# Patient Record
Sex: Female | Born: 1975 | Race: White | Hispanic: No | Marital: Married | Smoking: Never smoker
Health system: Southern US, Community
[De-identification: ages and names within clinical notes are randomized; demographics above are authoritative.]

## PROBLEM LIST (undated history)

## (undated) DIAGNOSIS — F419 Anxiety disorder, unspecified: Secondary | ICD-10-CM

## (undated) DIAGNOSIS — I10 Essential (primary) hypertension: Secondary | ICD-10-CM

---

## 1996-05-01 HISTORY — PX: BREAST REDUCTION SURGERY: SHX8

## 1997-12-08 ENCOUNTER — Inpatient Hospital Stay (HOSPITAL_COMMUNITY): Admission: EM | Admit: 1997-12-08 | Discharge: 1997-12-10 | Payer: Self-pay | Admitting: Emergency Medicine

## 1998-01-15 ENCOUNTER — Other Ambulatory Visit: Admission: RE | Admit: 1998-01-15 | Discharge: 1998-01-15 | Payer: Self-pay | Admitting: Obstetrics and Gynecology

## 1999-03-08 ENCOUNTER — Other Ambulatory Visit: Admission: RE | Admit: 1999-03-08 | Discharge: 1999-03-08 | Payer: Self-pay | Admitting: Obstetrics and Gynecology

## 2000-05-15 ENCOUNTER — Other Ambulatory Visit: Admission: RE | Admit: 2000-05-15 | Discharge: 2000-05-15 | Payer: Self-pay | Admitting: Obstetrics and Gynecology

## 2001-02-19 ENCOUNTER — Other Ambulatory Visit: Admission: RE | Admit: 2001-02-19 | Discharge: 2001-02-19 | Payer: Self-pay | Admitting: Obstetrics and Gynecology

## 2002-03-07 ENCOUNTER — Other Ambulatory Visit: Admission: RE | Admit: 2002-03-07 | Discharge: 2002-03-07 | Payer: Self-pay | Admitting: Obstetrics and Gynecology

## 2002-09-07 ENCOUNTER — Inpatient Hospital Stay (HOSPITAL_COMMUNITY): Admission: AD | Admit: 2002-09-07 | Discharge: 2002-09-07 | Payer: Self-pay | Admitting: Obstetrics and Gynecology

## 2002-10-01 ENCOUNTER — Inpatient Hospital Stay (HOSPITAL_COMMUNITY): Admission: AD | Admit: 2002-10-01 | Discharge: 2002-10-06 | Payer: Self-pay | Admitting: Obstetrics and Gynecology

## 2002-10-07 ENCOUNTER — Encounter: Admission: RE | Admit: 2002-10-07 | Discharge: 2002-11-06 | Payer: Self-pay | Admitting: Obstetrics and Gynecology

## 2002-10-28 ENCOUNTER — Other Ambulatory Visit: Admission: RE | Admit: 2002-10-28 | Discharge: 2002-10-28 | Payer: Self-pay | Admitting: Obstetrics and Gynecology

## 2004-01-31 ENCOUNTER — Emergency Department (HOSPITAL_COMMUNITY): Admission: EM | Admit: 2004-01-31 | Discharge: 2004-01-31 | Payer: Self-pay | Admitting: Emergency Medicine

## 2004-09-13 ENCOUNTER — Ambulatory Visit: Payer: Self-pay | Admitting: Infectious Diseases

## 2004-09-19 ENCOUNTER — Ambulatory Visit: Payer: Self-pay | Admitting: Infectious Diseases

## 2006-12-27 ENCOUNTER — Inpatient Hospital Stay (HOSPITAL_COMMUNITY): Admission: AD | Admit: 2006-12-27 | Discharge: 2006-12-30 | Payer: Self-pay | Admitting: Obstetrics and Gynecology

## 2007-01-02 ENCOUNTER — Inpatient Hospital Stay (HOSPITAL_COMMUNITY): Admission: AD | Admit: 2007-01-02 | Discharge: 2007-01-02 | Payer: Self-pay | Admitting: *Deleted

## 2008-10-06 ENCOUNTER — Emergency Department (HOSPITAL_COMMUNITY): Admission: EM | Admit: 2008-10-06 | Discharge: 2008-10-06 | Payer: Self-pay | Admitting: Emergency Medicine

## 2009-01-05 ENCOUNTER — Emergency Department (HOSPITAL_COMMUNITY): Admission: EM | Admit: 2009-01-05 | Discharge: 2009-01-05 | Payer: Self-pay | Admitting: Emergency Medicine

## 2009-05-01 HISTORY — PX: APPENDECTOMY: SHX54

## 2009-08-09 ENCOUNTER — Inpatient Hospital Stay (HOSPITAL_COMMUNITY): Admission: EM | Admit: 2009-08-09 | Discharge: 2009-08-10 | Payer: Self-pay | Admitting: Surgery

## 2009-08-09 ENCOUNTER — Encounter (INDEPENDENT_AMBULATORY_CARE_PROVIDER_SITE_OTHER): Payer: Self-pay | Admitting: Surgery

## 2009-08-09 ENCOUNTER — Encounter: Payer: Self-pay | Admitting: Emergency Medicine

## 2010-07-20 LAB — GC/CHLAMYDIA PROBE AMP, GENITAL
Chlamydia, DNA Probe: NEGATIVE
GC Probe Amp, Genital: NEGATIVE

## 2010-07-20 LAB — DIFFERENTIAL
Basophils Absolute: 0 10*3/uL (ref 0.0–0.1)
Basophils Absolute: 0 10*3/uL (ref 0.0–0.1)
Basophils Absolute: 0 10*3/uL (ref 0.0–0.1)
Basophils Relative: 0 % (ref 0–1)
Basophils Relative: 0 % (ref 0–1)
Basophils Relative: 1 % (ref 0–1)
Eosinophils Absolute: 0.2 10*3/uL (ref 0.0–0.7)
Eosinophils Absolute: 0.2 10*3/uL (ref 0.0–0.7)
Eosinophils Absolute: 0.3 10*3/uL (ref 0.0–0.7)
Eosinophils Relative: 4 % (ref 0–5)
Eosinophils Relative: 4 % (ref 0–5)
Eosinophils Relative: 5 % (ref 0–5)
Lymphocytes Relative: 19 % (ref 12–46)
Lymphocytes Relative: 23 % (ref 12–46)
Lymphocytes Relative: 25 % (ref 12–46)
Lymphs Abs: 1 10*3/uL (ref 0.7–4.0)
Lymphs Abs: 1.2 10*3/uL (ref 0.7–4.0)
Lymphs Abs: 1.4 10*3/uL (ref 0.7–4.0)
Monocytes Absolute: 0.4 10*3/uL (ref 0.1–1.0)
Monocytes Absolute: 0.5 10*3/uL (ref 0.1–1.0)
Monocytes Absolute: 0.5 10*3/uL (ref 0.1–1.0)
Monocytes Relative: 10 % (ref 3–12)
Monocytes Relative: 8 % (ref 3–12)
Monocytes Relative: 9 % (ref 3–12)
Neutro Abs: 2.6 10*3/uL (ref 1.7–7.7)
Neutro Abs: 3.5 10*3/uL (ref 1.7–7.7)
Neutro Abs: 4.3 10*3/uL (ref 1.7–7.7)
Neutrophils Relative %: 62 % (ref 43–77)
Neutrophils Relative %: 62 % (ref 43–77)
Neutrophils Relative %: 69 % (ref 43–77)

## 2010-07-20 LAB — URINALYSIS, ROUTINE W REFLEX MICROSCOPIC
Bilirubin Urine: NEGATIVE
Glucose, UA: NEGATIVE mg/dL
Hgb urine dipstick: NEGATIVE
Ketones, ur: NEGATIVE mg/dL
Nitrite: NEGATIVE
Protein, ur: NEGATIVE mg/dL
Specific Gravity, Urine: 1.006 (ref 1.005–1.030)
Urobilinogen, UA: 0.2 mg/dL (ref 0.0–1.0)
pH: 7.5 (ref 5.0–8.0)

## 2010-07-20 LAB — COMPREHENSIVE METABOLIC PANEL
ALT: 16 U/L (ref 0–35)
AST: 16 U/L (ref 0–37)
Albumin: 3.9 g/dL (ref 3.5–5.2)
Alkaline Phosphatase: 58 U/L (ref 39–117)
BUN: 8 mg/dL (ref 6–23)
CO2: 26 mEq/L (ref 19–32)
Calcium: 9.1 mg/dL (ref 8.4–10.5)
Chloride: 105 mEq/L (ref 96–112)
Creatinine, Ser: 0.88 mg/dL (ref 0.4–1.2)
GFR calc Af Amer: >60 mL/min (ref >60)
GFR calc non Af Amer: >60 mL/min (ref >60)
Glucose, Bld: 108 mg/dL — ABNORMAL HIGH (ref 70–99)
Potassium: 3.8 mEq/L (ref 3.5–5.1)
Sodium: 137 mEq/L (ref 135–145)
Total Bilirubin: 0.8 mg/dL (ref 0.3–1.2)
Total Protein: 6.9 g/dL (ref 6.0–8.3)

## 2010-07-20 LAB — WET PREP, GENITAL
Trich, Wet Prep: NONE SEEN
WBC, Wet Prep HPF POC: NONE SEEN
Yeast Wet Prep HPF POC: NONE SEEN

## 2010-07-20 LAB — CBC
HCT: 32.9 % — ABNORMAL LOW (ref 36.0–46.0)
HCT: 35.3 % — ABNORMAL LOW (ref 36.0–46.0)
HCT: 37.3 % (ref 36.0–46.0)
Hemoglobin: 11.6 g/dL — ABNORMAL LOW (ref 12.0–15.0)
Hemoglobin: 12.7 g/dL (ref 12.0–15.0)
Hemoglobin: 13 g/dL (ref 12.0–15.0)
MCHC: 34.9 g/dL (ref 30.0–36.0)
MCHC: 35.2 g/dL (ref 30.0–36.0)
MCHC: 35.9 g/dL (ref 30.0–36.0)
MCV: 96 fL (ref 78.0–100.0)
MCV: 96.3 fL (ref 78.0–100.0)
MCV: 96.9 fL (ref 78.0–100.0)
Platelets: 175 10*3/uL (ref 150–400)
Platelets: 194 10*3/uL (ref 150–400)
Platelets: 210 10*3/uL (ref 150–400)
RBC: 3.4 MIL/uL — ABNORMAL LOW (ref 3.87–5.11)
RBC: 3.67 MIL/uL — ABNORMAL LOW (ref 3.87–5.11)
RBC: 3.89 MIL/uL (ref 3.87–5.11)
RDW: 12.4 % (ref 11.5–15.5)
RDW: 12.5 % (ref 11.5–15.5)
RDW: 12.8 % (ref 11.5–15.5)
WBC: 4.2 10*3/uL (ref 4.0–10.5)
WBC: 5.8 10*3/uL (ref 4.0–10.5)
WBC: 6.3 10*3/uL (ref 4.0–10.5)

## 2010-07-20 LAB — LIPASE, BLOOD: Lipase: 26 U/L (ref 11–59)

## 2010-07-20 LAB — POCT PREGNANCY, URINE: Preg Test, Ur: NEGATIVE

## 2010-09-13 NOTE — Op Note (Signed)
Jackie Leonard, Jackie Leonard             ACCOUNT NO.:  [id number]   MEDICAL RECORD NO.:  [id number]          PATIENT TYPE:  INP   LOCATION:  9128                          FACILITY:  [facility]   PHYSICIAN:  Lenoard Aden, M.D.DATE OF BIRTH:  01/13/1976   DATE OF PROCEDURE:  12/27/2006  DATE OF DISCHARGE:                               OPERATIVE REPORT   PREOPERATIVE DIAGNOSES:  1. A 37-week intrauterine pregnancy.  2. New onset mild preeclampsia with labile blood pressures.   POSTOPERATIVE DIAGNOSES:  1. A 37-week intrauterine pregnancy.  2. New onset mild preeclampsia with labile blood pressures.   PROCEDURE:  Repeat low segment transverse cesarean section.   SURGEON:  Lenoard Aden, M.D.   ASSISTANT:  Marlinda Mike, C.N.M.   ANESTHESIA:  Spinal.   BLOOD LOSS:  1000 mL.   COMPLICATIONS:  None.   DRAINS:  Foley.   COUNTS:  Correct.   Patient to recovery in good condition.   FINDINGS:  Full-term living female, Apgars 8 and 9, occiput transverse  position, delivered with vacuum one pull, placenta to L&D normal tubes,  normal ovaries.  Omental adhesions to anterior abdominal wall.   DESCRIPTION OF PROCEDURE:  After being apprised of risks of anesthesia,  infection, bleeding, injury to abdominal organs with need for repair,  delayed versus immediate complications to include bowel and bladder  injury with need for repair, patient brought to the operating where she  was administered spinal anesthetic without complications, prepped and  draped in usual sterile fashion.  Foley catheter placed.  After  achieving adequate anesthesia and dilute Marcaine solution placed, a  Pfannenstiel skin incision made with a scalpel, carried down to the  fascia which was nicked in the midline and opened transversely using  Mayo scissors.  Rectus muscles dissected sharply in the midline.  Peritoneum entered sharply.  Bladder blade placed.  Visceral peritoneum  scored sharply lower uterine segment.   Kerr hysterotomy incision made.  Atraumatic delivery of a full-term living female by vacuum assistance,  handed to pediatricians in attendance.  Apgars of 8 and 9.  Pediatricians in attendance.  Cord blood collected.  Placenta delivered  manually, intact three-vessel cord.  Uterus curetted using a dry lap  pack and closed in two running imbricating layers of 0 Monocryl suture.  Good hemostasis is noted.  Bladder flap is inspected and found to be  hemostatic.  Irrigation accomplished.  Omental adhesions to the anterior  abdominal wall were lysed using electrocautery without difficulty.  No  bleeding is noted at this time.  Fascia is then closed using 0 Monocryl  in continuous running fashion.  The old skin incision was removed by  sharp dissection with the scalpel.  Skin is then closed using staples.  The patient tolerates procedure well, to recovery, good condition.      Lenoard Aden, M.D.  Electronically Signed     RJT/MEDQ  D:  12/27/2006  T:  12/28/2006  Job:  [id number]

## 2010-09-16 NOTE — Op Note (Signed)
Jackie Leonard, Jackie Leonard                         ACCOUNT NO.:  [id number]   MEDICAL RECORD NO.:  [id number]                   PATIENT TYPE:  INP   LOCATION:  9125                                 FACILITY:  [facility]   PHYSICIAN:  Lenoard Aden, M.D.             DATE OF BIRTH:  [birth year]   DATE OF PROCEDURE:  10/03/2002  DATE OF DISCHARGE:                                 OPERATIVE REPORT   PREOPERATIVE DIAGNOSES:  1. Thirty-eight week intrauterine pregnancy.  2. Gestational hypertension.  3. Failed induction.   POSTOPERATIVE DIAGNOSES:  1. Thirty-eight week intrauterine pregnancy.  2. Gestational hypertension.  3. Failed induction.   PROCEDURE:  Primary low transverse cesarean section.   SURGEON:  Lenoard Aden, M.D.   ANESTHESIA:  Epidural.   ESTIMATED BLOOD LOSS:  1500 mL.   COUNTS:  Correct.   No complications.   DRAINS:  Foley.   Patient to recovery in good condition.   FINDINGS:  A full-term living female, 8 pounds 1 ounce, Apgars 7 and 9.  Placenta delivered from a posterior location intact, a three-vessel cord  noted, normal tubes and ovaries.  Uterine atony controlled with 40 units of  Pitocin in the IV solution in addition to Hemabate x1.  Patient to recovery  in good condition.   BRIEF OPERATIVE NOTE:  After being apprised of the risks of anesthesia,  infection, bleeding, injury to abdominal organs and need for repair, the  patient was brought to the operating room, where she was administered a  dosing of her epidural anesthetic without complications, prepped and draped  in the usual sterile fashion, a Foley catheter placed.  After achieving  adequate anesthesia, a dilute Marcaine solution placed in the area.  A  Pfannenstiel skin incision was then made with a scalpel, carried down to the  fascia and nicked in the midline and opened transversely using Mayo  scissors.  Rectus muscles dissected sharply in the midline, peritoneum  entered sharply, bladder  blade placed, visceral peritoneum scored in a smile-  like fashion, uterus scored in a smile-like fashion.  Atraumatic delivery of  a full-term living female, 8 pounds 1 ounce, who was handed to the  pediatrician in attendance, Apgars 7 and 9.  Placenta delivered from a  posterior location intact, three-vessel cord noted.  The uterus was  exteriorized, uterine atony controlled with IV Pitocin solution and  Hemabate.  Incision then closed in two layers using a 0 Monocryl suture  after curetting the uterus with a dry lap pack.  Good uterine tone is then  accomplished.  Bladder flap is inspected and found to be hemostatic.  The  uterus is replaced in the abdominal cavity and reinspected, the paracolic  gutters irrigated, all blood clots subsequently removed.  Inspection of the  fascial and rectus muscle surfaces reveals good hemostasis.  Fascia closed  using the 0 Monocryl in continuous running fashion, skin closed using  staples.  The patient tolerates the procedure well and is transferred to the  recovery room in good condition.                                               Lenoard Aden, M.D.    RJT/MEDQ  D:  10/03/2002  T:  10/03/2002  Job:  [id number]

## 2010-09-16 NOTE — Discharge Summary (Signed)
Jackie Leonard, Jackie Leonard             ACCOUNT NO.:  [id number]   MEDICAL RECORD NO.:  [id number]          PATIENT TYPE:  INP   LOCATION:  9128                          FACILITY:  [facility]   PHYSICIAN:  Lenoard Aden, M.D.DATE OF BIRTH:  Sep 18, 1975   DATE OF ADMISSION:  12/27/2006  DATE OF DISCHARGE:  12/30/2006                               DISCHARGE SUMMARY   HOSPITAL COURSE:  The patient underwent uncomplicated repeat C section.  Postoperative course uncomplicated.  Tolerated a regular diet well.   DISPOSITION:  Discharged to home day 3.   DISCHARGE INSTRUCTIONS:  Discharge teaching done.   FOLLOWUP:  In the office in 4 to 6 weeks. Hypertensive precautions  discussed.   DISCHARGE MEDICATIONS:  To include  1. Labetalol.  2. Prenatal vitamins.  3. Iron.  4. Tylox.   FOLLOWUP:  As noted.      Lenoard Aden, M.D.  Electronically Signed     RJT/MEDQ  D:  01/29/2007  T:  01/29/2007  Job:  [id number]

## 2010-09-16 NOTE — H&P (Signed)
   Jackie Leonard, Jackie Leonard                         ACCOUNT NO.:  [id number]   MEDICAL RECORD NO.:  [id number]                   PATIENT TYPE:  INP   LOCATION:  9173                                 FACILITY:  [facility]   PHYSICIAN:  Lenoard Aden, M.D.             DATE OF BIRTH:  [birth year]   DATE OF ADMISSION:  10/01/2002  DATE OF DISCHARGE:                                HISTORY & PHYSICAL   CHIEF COMPLAINT:  Pregnancy induced hypertension.   HISTORY OF PRESENT ILLNESS:  The patient is a 35 year old white female, G2,  P0, EDD of October 19, 2002, at [redacted] weeks gestation who presents for induction  due to stable pregnancy induced hypertension.  She is also in need of  cervical ripening to begin this evening.   MEDICATIONS:  1. Labetalol.  2. Zoloft.   ALLERGIES:  1. VICODIN.  2. CODEINE.   PAST MEDICAL HISTORY:  1. Acute pyelonephritis.  2. Migraine headaches.  3. Breast reduction.  4. Anxiety.  5. Depression.   FAMILY HISTORY:  Breast cancer, stroke, and coronary artery disease, and  hypertension.   OBSTETRICAL HISTORY:  Remarkable for a SAB in 2003.   LABORATORY DATA:  Blood type of O positive, Rh antibody negative, rubella  immune, Hepatitis B surface antigen and HIV nonreactive, Group B Strep was  negative.   PHYSICAL EXAMINATION:  GENERAL:  She is a well-developed, well-nourished  white female in no acute distress.  HEENT:  Normal.  LUNGS:  Clear.  HEART:  Regular rate and rhythm.  ABDOMEN:  Soft, gravid, night sweats, estimated fetal weight on ultrasound  is 8 pounds.  PELVIC:  Cervix is closed, 2.5 cm long, firm, vertex, -2.  EXTREMITIES:  Deep tendon reflexes of 1 to 2+, edema 2+, no clonus.  NEUROLOGIC:  Nonfocal.   IMPRESSION:  1. A 38-week intrauterine pregnancy.  2. Gestational hypertension, stable on labetalol.  3. Depression, stable on Zoloft.   PLAN:  Proceed with cervical ripening and induction, and favorable attempts  at vaginal delivery.                                              Lenoard Aden, M.D.    RJT/MEDQ  D:  10/01/2002  T:  10/01/2002  Job:  [id number]

## 2010-09-16 NOTE — Discharge Summary (Signed)
   NAMEVERLENE, GLANTZ                         ACCOUNT NO.:  [id number]   MEDICAL RECORD NO.:  [id number]                   PATIENT TYPE:  INP   LOCATION:  9125                                 [facility]ACILITY:  [facility]   PHYSICIAN:  Lenoard Aden, M.D.             DATE OF BIRTH:  [birth year]   DATE OF ADMISSION:  10/01/2002  DATE OF DISCHARGE:  10/06/2002                                 DISCHARGE SUMMARY   DISCHARGE SUMMARY:  Patient admitted for cervical ripening and induction due  to pre-eclampsia at term, had active phase arrest, uncomplicated primary C-  section, discharged to home on day three.  Prenatal vitamins and iron given.  Discharged to home on Labetalol.  Discharge teaching done.                                               Lenoard Aden, M.D.    RJT/MEDQ  D:  11/01/2002  T:  11/02/2002  Job:  [id number]

## 2011-02-10 LAB — URIC ACID
Uric Acid, Serum: 5.4
Uric Acid, Serum: 5.6

## 2011-02-10 LAB — COMPREHENSIVE METABOLIC PANEL
ALT: 29
ALT: 17
AST: 30
AST: 28
Albumin: 2.7 — ABNORMAL LOW
Albumin: 2.8 — ABNORMAL LOW
Alkaline Phosphatase: 81
Alkaline Phosphatase: 106
BUN: 8
BUN: 2 — ABNORMAL LOW
CO2: 26
CO2: 22
Calcium: 9
Calcium: 12.6 — ABNORMAL HIGH
Chloride: 104
Chloride: 107
Creatinine, Ser: 0.83
Creatinine, Ser: 0.73
GFR calc Af Amer: >60
GFR calc Af Amer: >60
GFR calc non Af Amer: >60
GFR calc non Af Amer: >60
Glucose, Bld: 92
Glucose, Bld: 92
Potassium: 3.5
Potassium: 4
Sodium: 135
Sodium: 136
Total Bilirubin: 0.4
Total Bilirubin: 0.9
Total Protein: 5.8 — ABNORMAL LOW
Total Protein: 5.9 — ABNORMAL LOW

## 2011-02-10 LAB — CBC
HCT: 27.4 — ABNORMAL LOW
HCT: 26 — ABNORMAL LOW
HCT: 33.7 — ABNORMAL LOW
Hemoglobin: 9.7 — ABNORMAL LOW
Hemoglobin: 11.9 — ABNORMAL LOW
Hemoglobin: 9.3 — ABNORMAL LOW
MCHC: 35.6
MCHC: 35.4
MCHC: 35.6
MCV: 96.4
MCV: 97.1
MCV: 97.4
Platelets: 297
Platelets: 138 — ABNORMAL LOW
Platelets: 203
RBC: 2.84 — ABNORMAL LOW
RBC: 2.67 — ABNORMAL LOW
RBC: 3.47 — ABNORMAL LOW
RDW: 13.6
RDW: 14.2 — ABNORMAL HIGH
RDW: 14.4 — ABNORMAL HIGH
WBC: 7.2
WBC: 5.8
WBC: 7.3

## 2011-02-10 LAB — URINALYSIS, ROUTINE W REFLEX MICROSCOPIC
Bilirubin Urine: NEGATIVE
Glucose, UA: NEGATIVE
Ketones, ur: NEGATIVE
Leukocytes, UA: NEGATIVE
Nitrite: NEGATIVE
Protein, ur: NEGATIVE
Specific Gravity, Urine: 1.01
Urobilinogen, UA: 0.2
pH: 7

## 2011-02-10 LAB — LACTATE DEHYDROGENASE
LDH: 210
LDH: 162

## 2011-02-10 LAB — URINE MICROSCOPIC-ADD ON

## 2011-02-10 LAB — RPR: RPR Ser Ql: NONREACTIVE

## 2011-11-05 IMAGING — CT CT ABD-PELV W/ CM
1 series · 15 of 32 positions shown, 19 images · IV contrast (agent unspecified)
Comparison: None.

CLINICAL DATA: Right sided abdominal pain with nausea.

CT ABDOMEN AND PELVIS WITH CONTRAST
TECHNIQUE: Multidetector CT imaging of the abdomen and pelvis was
performed following the standard protocol during bolus
administration of intravenous contrast.
Contrast: 100 ml 6mnipaque-PEE

[Series 2: rtn ap with st · axial · 0.74mm/px · z∈[-458,-38]mm · 15 of 94 slices shown, 19 images]
[im 7/94  soft-tissue]
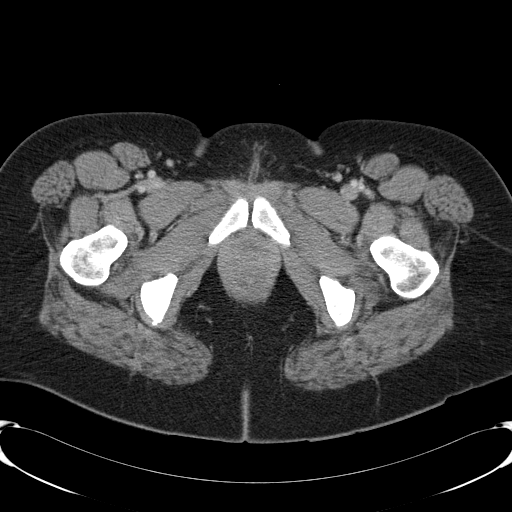
[im 7/94  bone]
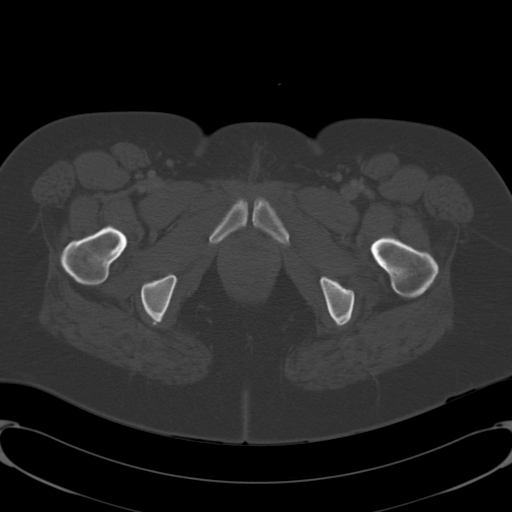
[im 13/94  soft-tissue]
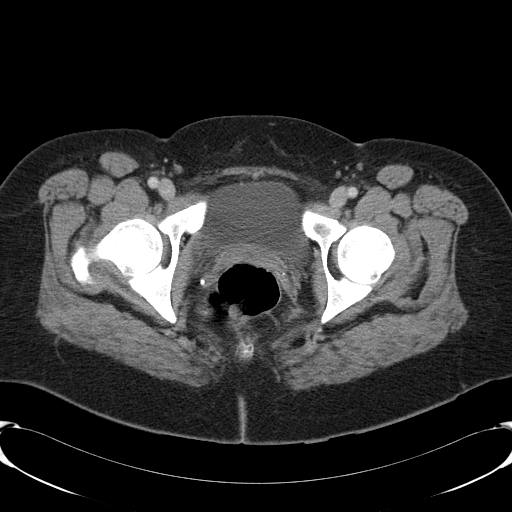
[im 19/94  soft-tissue]
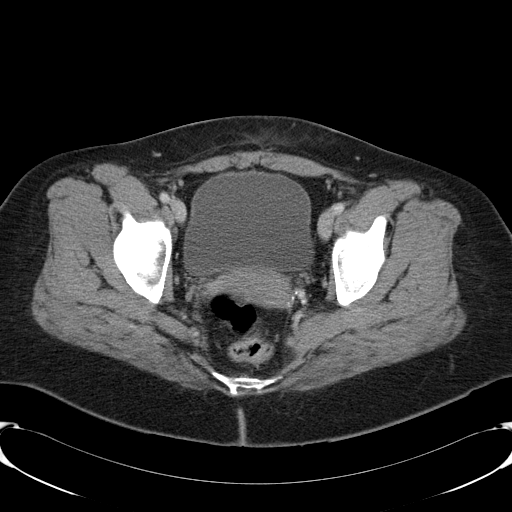
[im 28/94  soft-tissue]
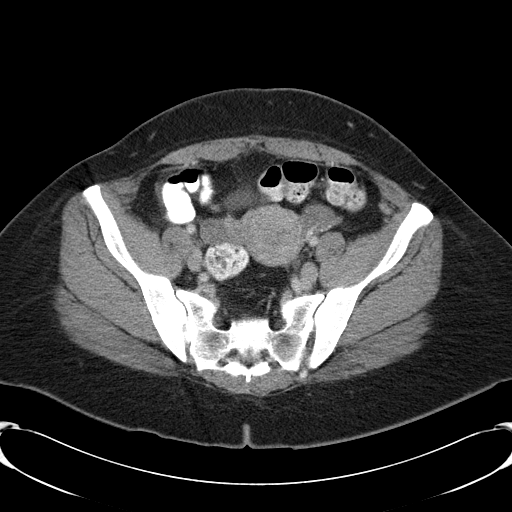
[im 34/94  soft-tissue]
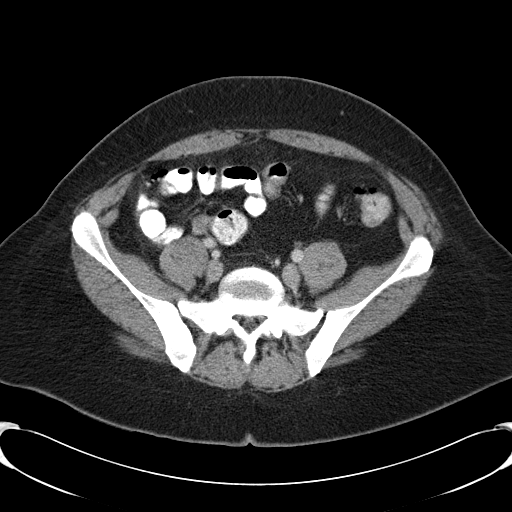
[im 40/94  soft-tissue]
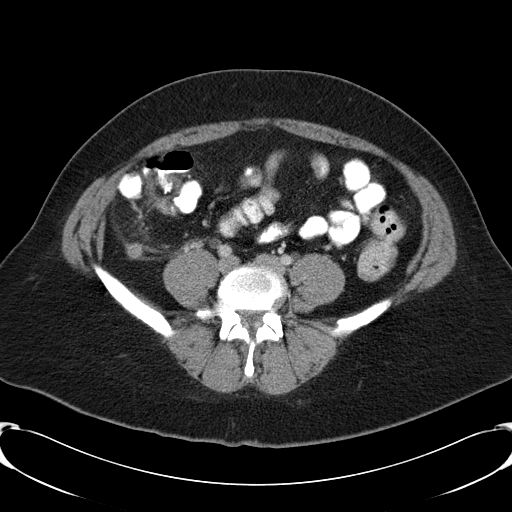
[im 49/94  soft-tissue]
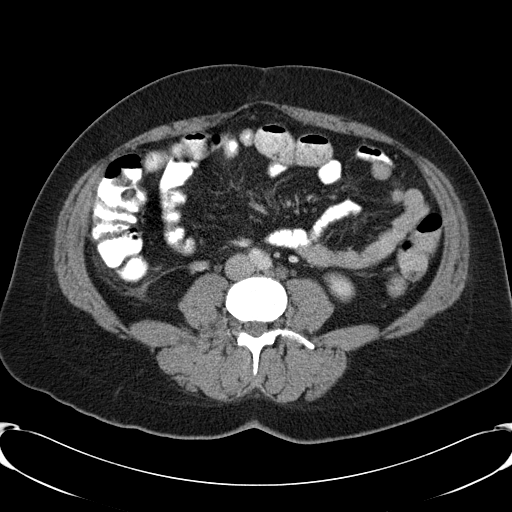
[im 55/94  soft-tissue]
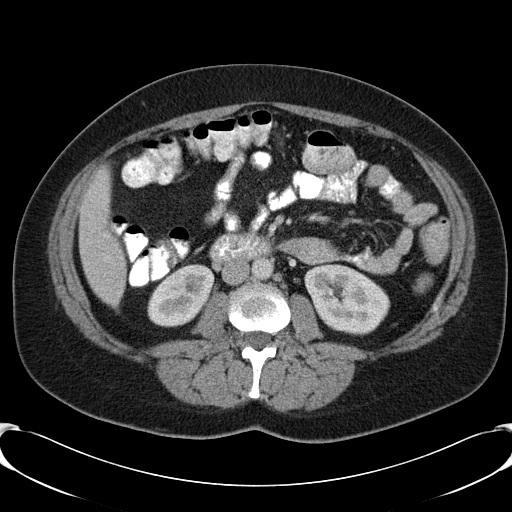
[im 61/94  soft-tissue]
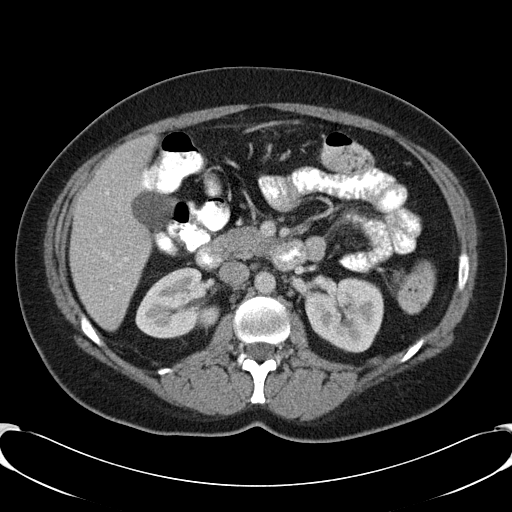
[im 61/94  bone]
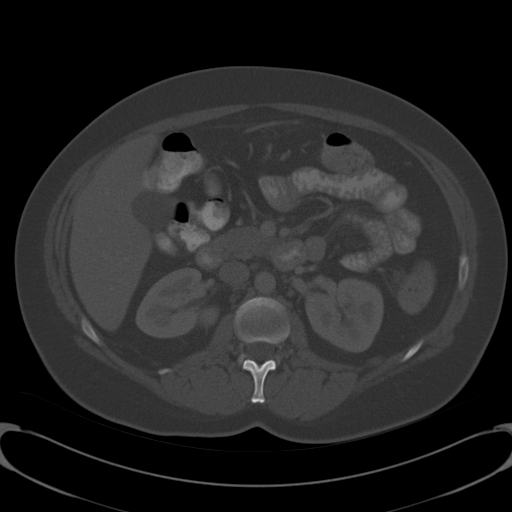
[im 67/94  soft-tissue]
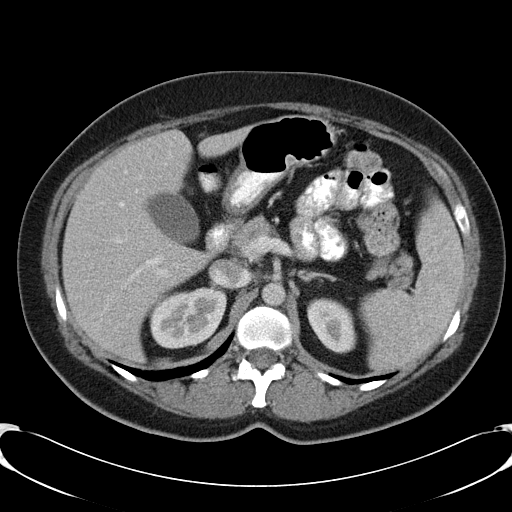
[im 76/94  soft-tissue]
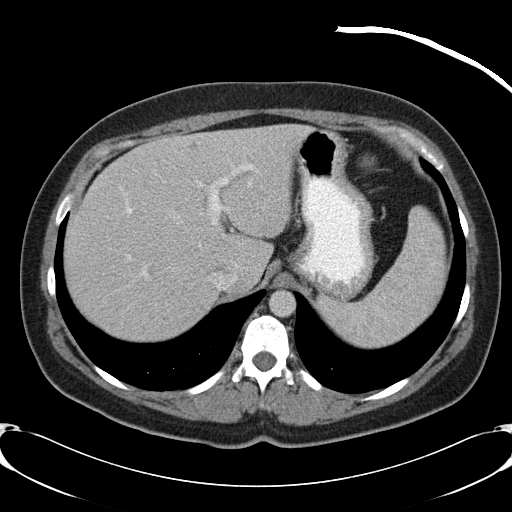
[im 82/94  soft-tissue]
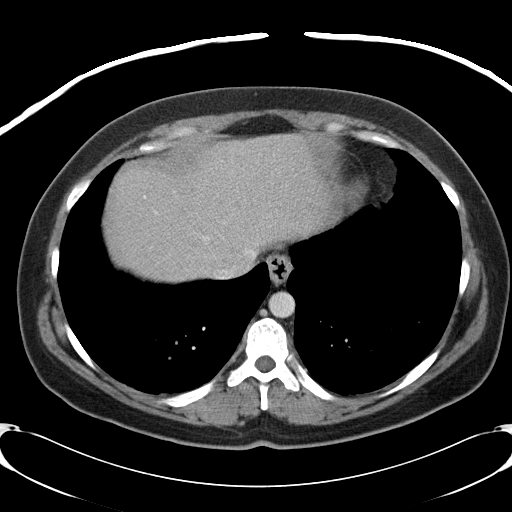
[im 82/94  lung]
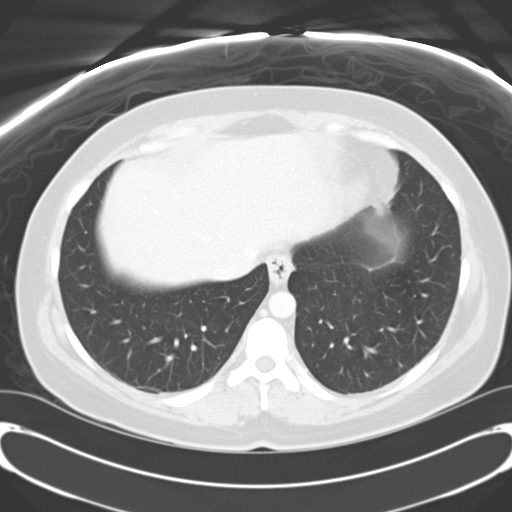
[im 85/94  lung]
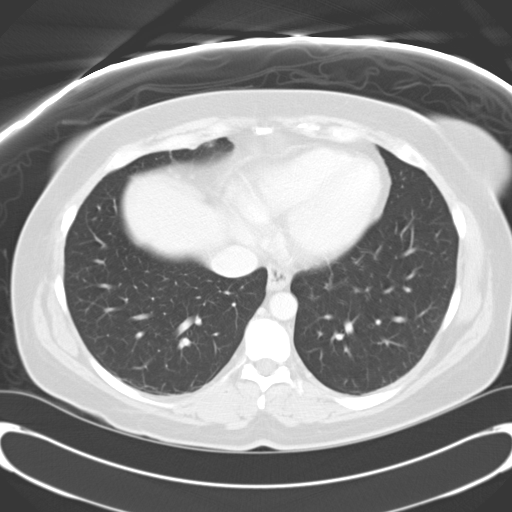
[im 88/94  soft-tissue]
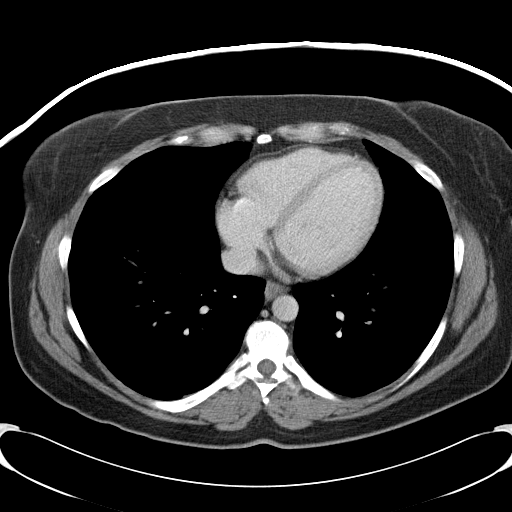
[im 88/94  lung]
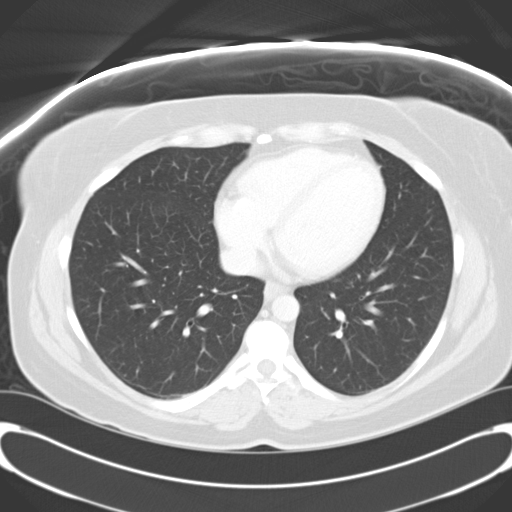
[im 91/94  lung]
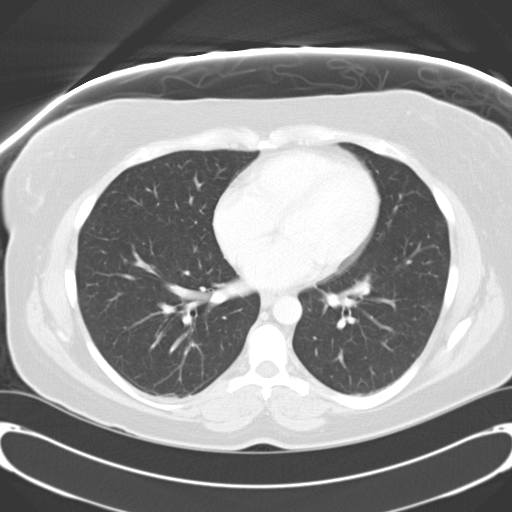

[15 of 32 positions shown; findings below may reference images not displayed]

FINDINGS: There may be a tiny subpleural lymph node along the
right hemidiaphragm.  Minimal subpleural subsegmental atelectasis
or scarring in both lower lobes.  Heart size normal.  No
pericardial or pleural effusion.

A sub centimeter low attenuation lesion in the liver may represent
a cyst.  Liver, gallbladder, adrenal glands and right kidney are
otherwise unremarkable.  Cannot exclude tiny stones in the left
kidney.  No obstruction. Spleen, pancreas, stomach and small bowel
are unremarkable.  There are inflammatory changes in the right
lower quadrant, surrounding a mildly dilated appendix, measuring 11
mm.  No evidence of perforation.  Colon is unremarkable.  Uterus
and ovaries are visualized.  Trace free fluid.  No pathologically
enlarged lymph nodes. No worrisome lytic or sclerotic lesions.
IMPRESSION: Acute appendicitis without evidence of rupture.

## 2012-09-05 ENCOUNTER — Other Ambulatory Visit: Payer: Self-pay | Admitting: Nurse Practitioner

## 2012-09-05 DIAGNOSIS — E039 Hypothyroidism, unspecified: Secondary | ICD-10-CM

## 2012-09-05 DIAGNOSIS — R7989 Other specified abnormal findings of blood chemistry: Secondary | ICD-10-CM

## 2012-09-06 ENCOUNTER — Ambulatory Visit
Admission: RE | Admit: 2012-09-06 | Discharge: 2012-09-06 | Disposition: A | Discharge status: Home / Self Care | Payer: 59 | Source: Ambulatory Visit | Attending: Nurse Practitioner | Admitting: Nurse Practitioner

## 2012-09-06 DIAGNOSIS — E039 Hypothyroidism, unspecified: Secondary | ICD-10-CM

## 2012-09-06 DIAGNOSIS — R7989 Other specified abnormal findings of blood chemistry: Secondary | ICD-10-CM

## 2013-05-01 HISTORY — PX: BACK SURGERY: SHX140

## 2014-03-31 ENCOUNTER — Other Ambulatory Visit: Payer: Self-pay | Admitting: Neurosurgery

## 2014-04-17 ENCOUNTER — Encounter (HOSPITAL_COMMUNITY): Payer: Self-pay

## 2014-04-17 ENCOUNTER — Encounter (HOSPITAL_COMMUNITY)
Admission: RE | Admit: 2014-04-17 | Discharge: 2014-04-17 | Disposition: A | Discharge status: Home / Self Care | Payer: 59 | Source: Ambulatory Visit | Attending: Neurosurgery | Admitting: Neurosurgery

## 2014-04-17 DIAGNOSIS — R000 Tachycardia, unspecified: Secondary | ICD-10-CM | POA: Diagnosis not present

## 2014-04-17 DIAGNOSIS — Z01818 Encounter for other preprocedural examination: Secondary | ICD-10-CM | POA: Diagnosis not present

## 2014-04-17 HISTORY — DX: Anxiety disorder, unspecified: F41.9

## 2014-04-17 HISTORY — DX: Essential (primary) hypertension: I10

## 2014-04-17 LAB — CBC WITH DIFFERENTIAL/PLATELET
Basophils Absolute: 0 10*3/uL (ref 0.0–0.1)
Basophils Relative: 1 % (ref 0–1)
EOS ABS: 0.3 10*3/uL (ref 0.0–0.7)
Eosinophils Relative: 7 % — ABNORMAL HIGH (ref 0–5)
HCT: 36 % (ref 36.0–46.0)
Hemoglobin: 12.7 g/dL (ref 12.0–15.0)
LYMPHS ABS: 1.3 10*3/uL (ref 0.7–4.0)
Lymphocytes Relative: 31 % (ref 12–46)
MCH: 33.6 pg (ref 26.0–34.0)
MCHC: 35.3 g/dL (ref 30.0–36.0)
MCV: 95.2 fL (ref 78.0–100.0)
Monocytes Absolute: 0.4 10*3/uL (ref 0.1–1.0)
Monocytes Relative: 9 % (ref 3–12)
NEUTROS ABS: 2.2 10*3/uL (ref 1.7–7.7)
NEUTROS PCT: 52 % (ref 43–77)
PLATELETS: 238 10*3/uL (ref 150–400)
RBC: 3.78 MIL/uL — AB (ref 3.87–5.11)
RDW: 11.5 % (ref 11.5–15.5)
WBC: 4.2 10*3/uL (ref 4.0–10.5)

## 2014-04-17 LAB — BASIC METABOLIC PANEL
ANION GAP: 11 (ref 5–15)
BUN: 13 mg/dL (ref 6–23)
CO2: 27 meq/L (ref 19–32)
Calcium: 9.1 mg/dL (ref 8.4–10.5)
Chloride: 100 mEq/L (ref 96–112)
Creatinine, Ser: 0.64 mg/dL (ref 0.50–1.10)
GFR calc non Af Amer: >90 mL/min (ref >90)
Glucose, Bld: 100 mg/dL — ABNORMAL HIGH (ref 70–99)
POTASSIUM: 4.1 meq/L (ref 3.7–5.3)
SODIUM: 138 meq/L (ref 137–147)

## 2014-04-17 LAB — ABO/RH: ABO/RH(D): O POS

## 2014-04-17 LAB — HCG, SERUM, QUALITATIVE: PREG SERUM: NEGATIVE

## 2014-04-17 LAB — TYPE AND SCREEN
ABO/RH(D): O POS
ANTIBODY SCREEN: NEGATIVE

## 2014-04-17 LAB — SURGICAL PCR SCREEN
MRSA, PCR: NEGATIVE
STAPHYLOCOCCUS AUREUS: NEGATIVE

## 2014-04-27 MED ORDER — CEFAZOLIN SODIUM-DEXTROSE 2-3 GM-% IV SOLR
2.0000 g | INTRAVENOUS | Status: AC
  Administered 2014-04-28: 2 g via INTRAVENOUS
  Filled 2014-04-27: qty 50

## 2014-04-27 MED ORDER — DEXAMETHASONE SODIUM PHOSPHATE 10 MG/ML IJ SOLN
10.0000 mg | INTRAMUSCULAR | Status: DC
  Filled 2014-04-27: qty 1

## 2014-04-28 ENCOUNTER — Inpatient Hospital Stay (HOSPITAL_COMMUNITY): Payer: 59

## 2014-04-28 ENCOUNTER — Encounter (HOSPITAL_COMMUNITY): Payer: Self-pay | Admitting: *Deleted

## 2014-04-28 ENCOUNTER — Encounter (HOSPITAL_COMMUNITY)
Admission: RE | Disposition: A | Discharge status: Home / Self Care | Payer: Self-pay | Source: Ambulatory Visit | Attending: Neurosurgery

## 2014-04-28 ENCOUNTER — Inpatient Hospital Stay (HOSPITAL_COMMUNITY): Payer: 59 | Admitting: Certified Registered Nurse Anesthetist

## 2014-04-28 ENCOUNTER — Inpatient Hospital Stay (HOSPITAL_COMMUNITY)
Admission: RE | Admit: 2014-04-28 | Discharge: 2014-04-29 | DRG: 460 | Disposition: A | Discharge status: Home / Self Care | Payer: 59 | Source: Ambulatory Visit | Attending: Neurosurgery | Admitting: Neurosurgery

## 2014-04-28 DIAGNOSIS — I10 Essential (primary) hypertension: Secondary | ICD-10-CM | POA: Diagnosis present

## 2014-04-28 DIAGNOSIS — Y92234 Operating room of hospital as the place of occurrence of the external cause: Secondary | ICD-10-CM

## 2014-04-28 DIAGNOSIS — M51369 Other intervertebral disc degeneration, lumbar region without mention of lumbar back pain or lower extremity pain: Secondary | ICD-10-CM | POA: Diagnosis present

## 2014-04-28 DIAGNOSIS — M4806 Spinal stenosis, lumbar region: Principal | ICD-10-CM | POA: Diagnosis present

## 2014-04-28 DIAGNOSIS — T84018A Broken internal joint prosthesis, other site, initial encounter: Secondary | ICD-10-CM | POA: Diagnosis not present

## 2014-04-28 DIAGNOSIS — M47816 Spondylosis without myelopathy or radiculopathy, lumbar region: Secondary | ICD-10-CM | POA: Diagnosis present

## 2014-04-28 DIAGNOSIS — M40209 Unspecified kyphosis, site unspecified: Secondary | ICD-10-CM | POA: Diagnosis present

## 2014-04-28 DIAGNOSIS — M479 Spondylosis, unspecified: Secondary | ICD-10-CM

## 2014-04-28 DIAGNOSIS — F419 Anxiety disorder, unspecified: Secondary | ICD-10-CM | POA: Diagnosis present

## 2014-04-28 DIAGNOSIS — M545 Low back pain: Secondary | ICD-10-CM | POA: Diagnosis present

## 2014-04-28 DIAGNOSIS — M5136 Other intervertebral disc degeneration, lumbar region: Secondary | ICD-10-CM | POA: Diagnosis present

## 2014-04-28 HISTORY — PX: MAXIMUM ACCESS (MAS)POSTERIOR LUMBAR INTERBODY FUSION (PLIF) 1 LEVEL: SHX6368

## 2014-04-28 SURGERY — FOR MAXIMUM ACCESS (MAS) POSTERIOR LUMBAR INTERBODY FUSION (PLIF) 1 LEVEL
Anesthesia: General | Site: Back

## 2014-04-28 MED ORDER — FENTANYL CITRATE 0.05 MG/ML IJ SOLN
INTRAMUSCULAR | Status: AC
  Filled 2014-04-28: qty 5

## 2014-04-28 MED ORDER — PHENOL 1.4 % MT LIQD: 1.0000 | OROMUCOSAL | Status: DC | PRN

## 2014-04-28 MED ORDER — LISINOPRIL-HYDROCHLOROTHIAZIDE 20-12.5 MG PO TABS: 1.0000 | ORAL_TABLET | Freq: Every day | ORAL | Status: DC

## 2014-04-28 MED ORDER — HYDROMORPHONE HCL 1 MG/ML IJ SOLN
INTRAMUSCULAR | Status: AC
  Filled 2014-04-28: qty 1

## 2014-04-28 MED ORDER — VENLAFAXINE HCL ER 150 MG PO CP24
150.0000 mg | ORAL_CAPSULE | Freq: Every day | ORAL | Status: DC
  Administered 2014-04-28 – 2014-04-29 (×2): 150 mg via ORAL
  Filled 2014-04-28 (×3): qty 1

## 2014-04-28 MED ORDER — DEXAMETHASONE SODIUM PHOSPHATE 4 MG/ML IJ SOLN
INTRAMUSCULAR | Status: DC | PRN
  Administered 2014-04-28: 10 mg via INTRAVENOUS

## 2014-04-28 MED ORDER — ARTIFICIAL TEARS OP OINT
TOPICAL_OINTMENT | OPHTHALMIC | Status: AC
  Filled 2014-04-28: qty 7

## 2014-04-28 MED ORDER — PROPOFOL 10 MG/ML IV BOLUS
INTRAVENOUS | Status: DC | PRN
Start: 2014-04-28 — End: 2014-04-28
  Administered 2014-04-28: 140 mg via INTRAVENOUS

## 2014-04-28 MED ORDER — EPHEDRINE SULFATE 50 MG/ML IJ SOLN
INTRAMUSCULAR | Status: AC
  Filled 2014-04-28: qty 1

## 2014-04-28 MED ORDER — LISDEXAMFETAMINE DIMESYLATE 30 MG PO CAPS
60.0000 mg | ORAL_CAPSULE | Freq: Every day | ORAL | Status: DC
  Administered 2014-04-29: 60 mg via ORAL
  Filled 2014-04-28 (×2): qty 2

## 2014-04-28 MED ORDER — ONDANSETRON HCL 4 MG/2ML IJ SOLN: 4.0000 mg | INTRAMUSCULAR | Status: DC | PRN

## 2014-04-28 MED ORDER — 0.9 % SODIUM CHLORIDE (POUR BTL) OPTIME
TOPICAL | Status: DC | PRN
  Administered 2014-04-28: 1000 mL

## 2014-04-28 MED ORDER — HYDROCHLOROTHIAZIDE 12.5 MG PO CAPS
12.5000 mg | ORAL_CAPSULE | Freq: Every day | ORAL | Status: DC
  Filled 2014-04-28 (×2): qty 1

## 2014-04-28 MED ORDER — SODIUM CHLORIDE 0.9 % IJ SOLN
3.0000 mL | Freq: Two times a day (BID) | INTRAMUSCULAR | Status: DC
  Administered 2014-04-28: 3 mL via INTRAVENOUS

## 2014-04-28 MED ORDER — BUPIVACAINE HCL (PF) 0.25 % IJ SOLN
INTRAMUSCULAR | Status: DC | PRN
  Administered 2014-04-28: 20 mL

## 2014-04-28 MED ORDER — PROPOFOL 10 MG/ML IV EMUL
INTRAVENOUS | Status: AC
  Administered 2014-04-28: 50 ug/kg/min via INTRAVENOUS
  Filled 2014-04-28: qty 200

## 2014-04-28 MED ORDER — SODIUM CHLORIDE 0.9 % IV SOLN: 250.0000 mL | INTRAVENOUS | Status: DC

## 2014-04-28 MED ORDER — FENTANYL CITRATE 0.05 MG/ML IJ SOLN
INTRAMUSCULAR | Status: DC | PRN
  Administered 2014-04-28 (×4): 50 ug via INTRAVENOUS
  Administered 2014-04-28: 150 ug via INTRAVENOUS
  Administered 2014-04-28: 50 ug via INTRAVENOUS

## 2014-04-28 MED ORDER — CEFAZOLIN SODIUM 1-5 GM-% IV SOLN
1.0000 g | Freq: Three times a day (TID) | INTRAVENOUS | Status: AC
  Administered 2014-04-28 (×2): 1 g via INTRAVENOUS
  Filled 2014-04-28 (×2): qty 50

## 2014-04-28 MED ORDER — LISINOPRIL 20 MG PO TABS: 20.0000 mg | ORAL_TABLET | Freq: Every day | ORAL | Status: DC

## 2014-04-28 MED ORDER — PROPOFOL 10 MG/ML IV BOLUS
INTRAVENOUS | Status: AC
  Filled 2014-04-28: qty 20

## 2014-04-28 MED ORDER — ONDANSETRON HCL 4 MG/2ML IJ SOLN
INTRAMUSCULAR | Status: AC
  Filled 2014-04-28: qty 2

## 2014-04-28 MED ORDER — OXYCODONE-ACETAMINOPHEN 5-325 MG PO TABS
1.0000 | ORAL_TABLET | ORAL | Status: DC | PRN
  Administered 2014-04-28 – 2014-04-29 (×5): 2 via ORAL
  Filled 2014-04-28 (×5): qty 2

## 2014-04-28 MED ORDER — LORAZEPAM 0.5 MG PO TABS: 1.0000 mg | ORAL_TABLET | ORAL | Status: DC | PRN

## 2014-04-28 MED ORDER — DIAZEPAM 5 MG PO TABS
5.0000 mg | ORAL_TABLET | Freq: Four times a day (QID) | ORAL | Status: DC | PRN
  Administered 2014-04-28: 5 mg via ORAL
  Administered 2014-04-29: 10 mg via ORAL
  Administered 2014-04-29: 5 mg via ORAL
  Filled 2014-04-28: qty 1
  Filled 2014-04-28: qty 2
  Filled 2014-04-28: qty 1

## 2014-04-28 MED ORDER — LISDEXAMFETAMINE DIMESYLATE 30 MG PO CAPS: 60.0000 mg | ORAL_CAPSULE | Freq: Every day | ORAL | Status: DC

## 2014-04-28 MED ORDER — MENTHOL 3 MG MT LOZG: 1.0000 | LOZENGE | OROMUCOSAL | Status: DC | PRN

## 2014-04-28 MED ORDER — VANCOMYCIN HCL 1000 MG IV SOLR
INTRAVENOUS | Status: AC
  Filled 2014-04-28: qty 1000

## 2014-04-28 MED ORDER — THROMBIN 20000 UNITS EX SOLR
CUTANEOUS | Status: DC | PRN
  Administered 2014-04-28: 20 mL via TOPICAL

## 2014-04-28 MED ORDER — ARTIFICIAL TEARS OP OINT
TOPICAL_OINTMENT | OPHTHALMIC | Status: DC | PRN
  Administered 2014-04-28: 1 via OPHTHALMIC

## 2014-04-28 MED ORDER — HYDROMORPHONE HCL 1 MG/ML IJ SOLN
0.5000 mg | INTRAMUSCULAR | Status: DC | PRN
  Administered 2014-04-28 (×5): 1 mg via INTRAVENOUS
  Filled 2014-04-28 (×5): qty 1

## 2014-04-28 MED ORDER — LIDOCAINE HCL (CARDIAC) 20 MG/ML IV SOLN
INTRAVENOUS | Status: AC
  Filled 2014-04-28: qty 5

## 2014-04-28 MED ORDER — POLYETHYLENE GLYCOL 3350 17 G PO PACK
17.0000 g | PACK | Freq: Every day | ORAL | Status: DC | PRN
  Filled 2014-04-28: qty 1

## 2014-04-28 MED ORDER — STERILE WATER FOR INJECTION IJ SOLN
INTRAMUSCULAR | Status: AC
  Filled 2014-04-28: qty 10

## 2014-04-28 MED ORDER — ACETAMINOPHEN 650 MG RE SUPP: 650.0000 mg | RECTAL | Status: DC | PRN

## 2014-04-28 MED ORDER — HYDROCHLOROTHIAZIDE 12.5 MG PO CAPS: 12.5000 mg | ORAL_CAPSULE | Freq: Every day | ORAL | Status: DC

## 2014-04-28 MED ORDER — LISINOPRIL 20 MG PO TABS
20.0000 mg | ORAL_TABLET | Freq: Every day | ORAL | Status: DC
  Filled 2014-04-28 (×2): qty 1

## 2014-04-28 MED ORDER — CYCLOBENZAPRINE HCL 10 MG PO TABS
ORAL_TABLET | ORAL | Status: AC
  Filled 2014-04-28: qty 1

## 2014-04-28 MED ORDER — SODIUM CHLORIDE 0.9 % IJ SOLN: 3.0000 mL | INTRAMUSCULAR | Status: DC | PRN

## 2014-04-28 MED ORDER — SENNA 8.6 MG PO TABS
1.0000 | ORAL_TABLET | Freq: Two times a day (BID) | ORAL | Status: DC
  Administered 2014-04-28: 8.6 mg via ORAL
  Filled 2014-04-28 (×3): qty 1

## 2014-04-28 MED ORDER — SUCCINYLCHOLINE CHLORIDE 20 MG/ML IJ SOLN
INTRAMUSCULAR | Status: AC
  Filled 2014-04-28: qty 2

## 2014-04-28 MED ORDER — FLEET ENEMA 7-19 GM/118ML RE ENEM
1.0000 | ENEMA | Freq: Once | RECTAL | Status: AC | PRN
  Filled 2014-04-28: qty 1

## 2014-04-28 MED ORDER — VENLAFAXINE HCL ER 150 MG PO CP24
150.0000 mg | ORAL_CAPSULE | Freq: Every day | ORAL | Status: DC
  Filled 2014-04-28: qty 1

## 2014-04-28 MED ORDER — CYCLOBENZAPRINE HCL 10 MG PO TABS
10.0000 mg | ORAL_TABLET | Freq: Three times a day (TID) | ORAL | Status: DC | PRN
  Administered 2014-04-28 (×2): 10 mg via ORAL
  Filled 2014-04-28 (×2): qty 1

## 2014-04-28 MED ORDER — ROCURONIUM BROMIDE 50 MG/5ML IV SOLN
INTRAVENOUS | Status: AC
  Filled 2014-04-28: qty 1

## 2014-04-28 MED ORDER — PHENYLEPHRINE HCL 10 MG/ML IJ SOLN
INTRAMUSCULAR | Status: DC | PRN
  Administered 2014-04-28: 40 ug via INTRAVENOUS

## 2014-04-28 MED ORDER — LIDOCAINE HCL (CARDIAC) 20 MG/ML IV SOLN
INTRAVENOUS | Status: DC | PRN
  Administered 2014-04-28: 60 mg via INTRAVENOUS

## 2014-04-28 MED ORDER — ONDANSETRON HCL 4 MG/2ML IJ SOLN
INTRAMUSCULAR | Status: DC | PRN
  Administered 2014-04-28: 4 mg via INTRAVENOUS

## 2014-04-28 MED ORDER — BISACODYL 10 MG RE SUPP: 10.0000 mg | Freq: Every day | RECTAL | Status: DC | PRN

## 2014-04-28 MED ORDER — HYDROCODONE-ACETAMINOPHEN 5-325 MG PO TABS: 1.0000 | ORAL_TABLET | ORAL | Status: DC | PRN

## 2014-04-28 MED ORDER — MIDAZOLAM HCL 2 MG/2ML IJ SOLN
INTRAMUSCULAR | Status: AC
  Filled 2014-04-28: qty 2

## 2014-04-28 MED ORDER — SUCCINYLCHOLINE CHLORIDE 20 MG/ML IJ SOLN
INTRAMUSCULAR | Status: DC | PRN
  Administered 2014-04-28: 100 mg via INTRAVENOUS

## 2014-04-28 MED ORDER — ALUM & MAG HYDROXIDE-SIMETH 200-200-20 MG/5ML PO SUSP: 30.0000 mL | Freq: Four times a day (QID) | ORAL | Status: DC | PRN

## 2014-04-28 MED ORDER — HYDROMORPHONE HCL 1 MG/ML IJ SOLN
0.2500 mg | INTRAMUSCULAR | Status: DC | PRN
  Administered 2014-04-28 (×4): 0.5 mg via INTRAVENOUS

## 2014-04-28 MED ORDER — VANCOMYCIN HCL 1000 MG IV SOLR
INTRAVENOUS | Status: DC | PRN
  Administered 2014-04-28: 1000 mg

## 2014-04-28 MED ORDER — ACETAMINOPHEN 325 MG PO TABS: 650.0000 mg | ORAL_TABLET | ORAL | Status: DC | PRN

## 2014-04-28 MED ORDER — MIDAZOLAM HCL 5 MG/5ML IJ SOLN
INTRAMUSCULAR | Status: DC | PRN
  Administered 2014-04-28: 2 mg via INTRAVENOUS

## 2014-04-28 MED ORDER — SODIUM CHLORIDE 0.9 % IR SOLN
Status: DC | PRN
  Administered 2014-04-28: 500 mL

## 2014-04-28 MED ORDER — EPHEDRINE SULFATE 50 MG/ML IJ SOLN
INTRAMUSCULAR | Status: DC | PRN
  Administered 2014-04-28: 10 mg via INTRAVENOUS
  Administered 2014-04-28: 15 mg via INTRAVENOUS

## 2014-04-28 MED ORDER — LACTATED RINGERS IV SOLN
INTRAVENOUS | Status: DC | PRN
  Administered 2014-04-28 (×3): via INTRAVENOUS

## 2014-04-28 SURGICAL SUPPLY — 63 items
BAG DECANTER FOR FLEXI CONT (MISCELLANEOUS) ×2 IMPLANT
BENZOIN TINCTURE PRP APPL 2/3 (GAUZE/BANDAGES/DRESSINGS) ×2 IMPLANT
BLADE CLIPPER SURG (BLADE) IMPLANT
BRUSH SCRUB EZ PLAIN DRY (MISCELLANEOUS) ×2 IMPLANT
BUR MATCHSTICK NEURO 3.0 LAGG (BURR) ×2 IMPLANT
CAGE COROENT MP 8X9X23M-8 SPIN (Cage) ×4 IMPLANT
CLIP NEUROVISION LG (CLIP) ×2 IMPLANT
CONT SPEC 4OZ CLIKSEAL STRL BL (MISCELLANEOUS) IMPLANT
COVER BACK TABLE 24X17X13 BIG (DRAPES) IMPLANT
COVER BACK TABLE 60X90IN (DRAPES) ×2 IMPLANT
DERMABOND ADHESIVE PROPEN (GAUZE/BANDAGES/DRESSINGS) ×1
DERMABOND ADVANCED .7 DNX6 (GAUZE/BANDAGES/DRESSINGS) ×1 IMPLANT
DRAPE C-ARM 42X72 X-RAY (DRAPES) ×2 IMPLANT
DRAPE C-ARMOR (DRAPES) ×2 IMPLANT
DRAPE LAPAROTOMY 100X72X124 (DRAPES) ×2 IMPLANT
DRAPE SURG 17X23 STRL (DRAPES) ×8 IMPLANT
DRSG OPSITE POSTOP 4X6 (GAUZE/BANDAGES/DRESSINGS) ×2 IMPLANT
DURAPREP 26ML APPLICATOR (WOUND CARE) ×2 IMPLANT
ELECT BLADE 4.0 EZ CLEAN MEGAD (MISCELLANEOUS)
ELECT REM PT RETURN 9FT ADLT (ELECTROSURGICAL) ×2
ELECTRODE BLDE 4.0 EZ CLN MEGD (MISCELLANEOUS) IMPLANT
ELECTRODE REM PT RTRN 9FT ADLT (ELECTROSURGICAL) ×1 IMPLANT
EVACUATOR 1/8 PVC DRAIN (DRAIN) IMPLANT
GAUZE SPONGE 4X4 12PLY STRL (GAUZE/BANDAGES/DRESSINGS) ×2 IMPLANT
GAUZE SPONGE 4X4 16PLY XRAY LF (GAUZE/BANDAGES/DRESSINGS) IMPLANT
GLOVE BIOGEL PI IND STRL 7.0 (GLOVE) ×1 IMPLANT
GLOVE BIOGEL PI INDICATOR 7.0 (GLOVE) ×1
GLOVE ECLIPSE 6.5 STRL STRAW (GLOVE) ×8 IMPLANT
GLOVE ECLIPSE 9.0 STRL (GLOVE) ×2 IMPLANT
GLOVE EXAM NITRILE LRG STRL (GLOVE) IMPLANT
GLOVE EXAM NITRILE MD LF STRL (GLOVE) IMPLANT
GLOVE EXAM NITRILE XL STR (GLOVE) IMPLANT
GLOVE EXAM NITRILE XS STR PU (GLOVE) IMPLANT
GOWN STRL REUS W/ TWL LRG LVL3 (GOWN DISPOSABLE) ×2 IMPLANT
GOWN STRL REUS W/ TWL XL LVL3 (GOWN DISPOSABLE) ×2 IMPLANT
GOWN STRL REUS W/TWL 2XL LVL3 (GOWN DISPOSABLE) IMPLANT
GOWN STRL REUS W/TWL LRG LVL3 (GOWN DISPOSABLE) ×2
GOWN STRL REUS W/TWL XL LVL3 (GOWN DISPOSABLE) ×2
KIT BASIN OR (CUSTOM PROCEDURE TRAY) ×2 IMPLANT
KIT NEEDLE NVM5 EMG ELECT (KITS) ×1 IMPLANT
KIT NEEDLE NVM5 EMG ELECTRODE (KITS) ×1
KIT ROOM TURNOVER OR (KITS) ×2 IMPLANT
NEEDLE HYPO 22GX1.5 SAFETY (NEEDLE) ×2 IMPLANT
NS IRRIG 1000ML POUR BTL (IV SOLUTION) ×2 IMPLANT
PACK LAMINECTOMY NEURO (CUSTOM PROCEDURE TRAY) ×2 IMPLANT
ROD 5.5X40MM (Rod) ×4 IMPLANT
SCREW LOCK (Screw) ×4 IMPLANT
SCREW LOCK FXNS SPNE MAS PL (Screw) ×4 IMPLANT
SCREW PLIF MAS 5.0X25MM (Screw) ×4 IMPLANT
SCREW SHANK 5.0X30MM (Screw) ×4 IMPLANT
SCREW TULIP 5.5 (Screw) ×4 IMPLANT
SPONGE LAP 4X18 X RAY DECT (DISPOSABLE) IMPLANT
SPONGE SURGIFOAM ABS GEL SZ50 (HEMOSTASIS) IMPLANT
SUT VIC AB 0 CT1 18XCR BRD8 (SUTURE) ×2 IMPLANT
SUT VIC AB 0 CT1 8-18 (SUTURE) ×2
SUT VIC AB 2-0 CT1 18 (SUTURE) ×2 IMPLANT
SUT VIC AB 3-0 SH 8-18 (SUTURE) ×2 IMPLANT
SYR 20ML ECCENTRIC (SYRINGE) ×2 IMPLANT
TAPE STRIPS DRAPE STRL (GAUZE/BANDAGES/DRESSINGS) ×2 IMPLANT
TOWEL OR 17X24 6PK STRL BLUE (TOWEL DISPOSABLE) ×2 IMPLANT
TOWEL OR 17X26 10 PK STRL BLUE (TOWEL DISPOSABLE) ×2 IMPLANT
TRAY FOLEY CATH 14FRSI W/METER (CATHETERS) IMPLANT
WATER STERILE IRR 1000ML POUR (IV SOLUTION) ×2 IMPLANT

## 2014-04-28 NOTE — Anesthesia Postprocedure Evaluation (Signed)
  Anesthesia Post-op Note  Patient: Jackie Leonard  Procedure(s) Performed: Procedure(s) with comments: FOR MAXIMUM ACCESS (MAS) POSTERIOR LUMBAR INTERBODY FUSION (PLIF) 1 LEVEL (N/A) - FOR MAXIMUM ACCESS (MAS) POSTERIOR LUMBAR INTERBODY FUSION (PLIF) 1 LEVEL LUMBAR 4-5  Patient Location: PACU  Anesthesia Type:General  Level of Consciousness: awake and alert   Airway and Oxygen Therapy: Patient Spontanous Breathing  Post-op Pain: mild  Post-op Assessment: Post-op Vital signs reviewed, Patient's Cardiovascular Status Stable and Respiratory Function Stable  Post-op Vital Signs: Reviewed  Filed Vitals:   04/28/14 1215  BP:   Pulse: 84  Temp:   Resp: 13    Complications: No apparent anesthesia complications

## 2014-04-28 NOTE — Progress Notes (Signed)
Utilization review completed.  

## 2014-04-28 NOTE — Transfer of Care (Signed)
Immediate Anesthesia Transfer of Care Note  Patient: Jackie Leonard  Procedure(s) Performed: Procedure(s) with comments: FOR MAXIMUM ACCESS (MAS) POSTERIOR LUMBAR INTERBODY FUSION (PLIF) 1 LEVEL (N/A) - FOR MAXIMUM ACCESS (MAS) POSTERIOR LUMBAR INTERBODY FUSION (PLIF) 1 LEVEL LUMBAR 4-5  Patient Location: PACU  Anesthesia Type:General  Level of Consciousness: awake, alert  and oriented  Airway & Oxygen Therapy: Patient Spontanous Breathing and Patient connected to nasal cannula oxygen  Post-op Assessment: Report given to PACU RN and Post -op Vital signs reviewed and stable  Post vital signs: Reviewed and stable  Complications: No apparent anesthesia complications

## 2014-04-28 NOTE — Evaluation (Signed)
Occupational Therapy Evaluation Patient Details Name: Jackie Leonard MRN: [id number] DOB: [birth year] Today's Date: 04/28/2014    History of Present Illness 38 y.o. s/p MAXIMUM ACCESS (MAS) POSTERIOR LUMBAR INTERBODY FUSION (PLIF) 1 LEVEL LUMBAR 4-5.   Clinical Impression   Pt s/p above. Pt independent with ADLs, PTA. Feel pt will benefit from acute OT to increase independence and reinforce back precautions prior to d/c.     Follow Up Recommendations  No OT follow up;Supervision - Intermittent    Equipment Recommendations  3 in 1 bedside comode    Recommendations for Other Services       Precautions / Restrictions Precautions Precautions: Back Precaution Booklet Issued: Yes (comment) Precaution Comments: educated on back precautions Required Braces or Orthoses: Spinal Brace Spinal Brace: Lumbar corset;Applied in sitting position Restrictions Weight Bearing Restrictions: No      Mobility Bed Mobility Overal bed mobility: Needs Assistance Bed Mobility: Rolling;Sidelying to Sit;Sit to Sidelying Rolling: Supervision Sidelying to sit: Supervision     Sit to sidelying: Supervision General bed mobility comments: cues for log roll technique.  Transfers Overall transfer level: Needs assistance   Transfers: Sit to/from Stand Sit to Stand: Min guard         General transfer comment: cues for technique.    Balance                                            ADL Overall ADL's : Needs assistance/impaired                 Upper Body Dressing : Sitting;Moderate assistance   Lower Body Dressing: Minimal assistance;With adaptive equipment;Sit to/from stand   Toilet Transfer: Min guard;Ambulation (bed)   Toileting- Clothing Manipulation and Hygiene: Supervision/safety;Sit to/from stand   Tub/ Shower Transfer: Tub transfer;Min guard;Ambulation   Functional mobility during ADLs: Min guard General ADL Comments: Educated on LB dressing  technique and AE/cost/where to purchase. Pt practiced with reacher/sockaid. Educated on back brace. Educated on use of cup for oral care and placement of grooming items to avoid breaking precautions. Educated on positoning of pillows and to only sit up for one hour before returning to bed. Discussed incorporating back precautions into functional activities. Recommended someone be with her for tub transfer. Educated on tub transfer techniques and options for shower chair. Educated on what pt could use for toilet aide if it is or becomes an issue. Explained moving is beneficial.     Vision                     Perception     Praxis      Pertinent Vitals/Pain Pain Assessment: 0-10 Pain Score: 9  Pain Location: back Pain Intervention(s): Repositioned;Monitored during session;Patient requesting pain meds-RN notified     Hand Dominance Right   Extremity/Trunk Assessment Upper Extremity Assessment Upper Extremity Assessment: Overall WFL for tasks assessed   Lower Extremity Assessment Lower Extremity Assessment: Overall WFL for tasks assessed       Communication Communication Communication: No difficulties   Cognition Arousal/Alertness: Awake/alert Behavior During Therapy: WFL for tasks assessed/performed Overall Cognitive Status: Within Functional Limits for tasks assessed                     General Comments       Exercises       Shoulder Instructions  Home Living Family/patient expects to be discharged to:: Private residence Living Arrangements: Spouse/significant other Available Help at Discharge: Family Type of Home: House Home Access: Stairs to enter [employer] of Steps: 4 Entrance Stairs-Rails: None Home Layout: One level;Other (Comment) (with basement)     Bathroom Shower/Tub: Teacher, early years/pre: Standard     Home Equipment: Hand held shower head          Prior Functioning/Environment Level of  Independence: Independent             OT Diagnosis: Acute pain   OT Problem List: Decreased strength;Decreased range of motion;Decreased activity tolerance;Decreased knowledge of use of DME or AE;Decreased knowledge of precautions;Pain   OT Treatment/Interventions: Self-care/ADL training;DME and/or AE instruction;Therapeutic activities;Patient/family education;Balance training    OT Goals(Current goals can be found in the care plan section) Acute Rehab OT Goals Patient Stated Goal: not stated OT Goal Formulation: With patient Time For Goal Achievement: 05/05/14 Potential to Achieve Goals: Good ADL Goals Pt Will Perform Grooming: with modified independence;standing Pt Will Perform Lower Body Dressing: with modified independence;with adaptive equipment;sit to/from stand Pt Will Transfer to Toilet: with modified independence;ambulating Additional ADL Goal #1: Pt will independently verbalize and demonstrate 3/3 back precautions.  OT Frequency: Min 2X/week   Barriers to D/C:            Co-evaluation              End of Session Equipment Utilized During Treatment: Gait belt;Back brace Nurse Communication: Other (comment) (pain)  Activity Tolerance: Patient tolerated treatment well Patient left: in bed;with call bell/phone within reach;with family/visitor present   Time: 2820-6015 OT Time Calculation (min): 24 min Charges:  OT General Charges $OT Visit: 1 Procedure OT Evaluation $Initial OT Evaluation Tier I: 1 Procedure OT Treatments $Self Care/Home Management : 8-22 mins G-CodesBenito Mccreedy OTR/L [phone] 04/28/2014, 5:52 PM

## 2014-04-28 NOTE — Anesthesia Procedure Notes (Signed)
Procedure Name: Intubation Date/Time: 04/28/2014 7:57 AM Performed by: Ollen Bowl Pre-anesthesia Checklist: Patient identified, Timeout performed, Emergency Drugs available, Suction available and Patient being monitored Patient Re-evaluated:Patient Re-evaluated prior to inductionOxygen Delivery Method: Circle system utilized and Simple face mask Preoxygenation: Pre-oxygenation with 100% oxygen Intubation Type: IV induction Ventilation: Mask ventilation without difficulty Laryngoscope Size: Miller and 2 Grade View: Grade I Tube type: Oral Tube size: 7.5 mm Number of attempts: 1 Airway Equipment and Method: Patient positioned with wedge pillow and Stylet Placement Confirmation: ETT inserted through vocal cords under direct vision,  positive ETCO2 and breath sounds checked- equal and bilateral Secured at: 22 cm Tube secured with: Tape Dental Injury: Teeth and Oropharynx as per pre-operative assessment

## 2014-04-28 NOTE — Brief Op Note (Signed)
04/28/2014  10:14 AM  PATIENT:  Jackie Leonard  38 y.o. female  PRE-OPERATIVE DIAGNOSIS:  spondylosis  POST-OPERATIVE DIAGNOSIS:  spondylosis  PROCEDURE:  Procedure(s) with comments: FOR MAXIMUM ACCESS (MAS) POSTERIOR LUMBAR INTERBODY FUSION (PLIF) 1 LEVEL (N/A) - FOR MAXIMUM ACCESS (MAS) POSTERIOR LUMBAR INTERBODY FUSION (PLIF) 1 LEVEL LUMBAR 4-5  SURGEON:  Surgeon(s) and Role:    * Charlie Pitter, MD - Primary  PHYSICIAN ASSISTANT:   ASSISTANTS: none   ANESTHESIA:   general  EBL:  Total I/O In: 1000 [I.V.:1000] Out: 300 [Urine:100; Blood:200]  BLOOD ADMINISTERED:none  DRAINS: none   LOCAL MEDICATIONS USED:  MARCAINE     SPECIMEN:  No Specimen  DISPOSITION OF SPECIMEN:  N/A  COUNTS:  YES  TOURNIQUET:  * No tourniquets in log *  DICTATION: .Dragon Dictation  PLAN OF CARE: Admit to inpatient   PATIENT DISPOSITION:  PACU - hemodynamically stable.   Delay start of Pharmacological VTE agent (>24hrs) due to surgical blood loss or risk of bleeding: yes

## 2014-04-28 NOTE — H&P (Signed)
Jackie Leonard is an 38 y.o. female.   Chief Complaint: Back pain HPI: 38 year old female with back and left lower extremity pain failing conservative management. Workup demonstrates evidence of a broad-based disc herniation with marked disc degeneration and foraminal stenosis. Patient is status post previous left-sided extraforaminal microdiscectomy. She presents now for L4-5 decompression infusion in hopes of improving her symptoms.  Past Medical History  Diagnosis Date  . Hypertension   . Anxiety     Past Surgical History  Procedure Laterality Date  . Appendectomy  2011  . Back surgery  2015  . Breast reduction surgery  1998  . Cesarean section      History reviewed. No pertinent family history. Social History:  reports that she has never smoked. She does not have any smokeless tobacco history on file. She reports that she drinks alcohol. She reports that she does not use illicit drugs.  Allergies: No Known Allergies  Medications Prior to Admission  Medication Sig Dispense Refill  . desvenlafaxine (PRISTIQ) 100 MG 24 hr tablet Take 100 mg by mouth daily.    Marland Kitchen lisdexamfetamine (VYVANSE) 60 MG capsule Take 60 mg by mouth every morning.    Marland Kitchen lisinopril-hydrochlorothiazide (PRINZIDE,ZESTORETIC) 20-12.5 MG per tablet Take 1 tablet by mouth daily.    Marland Kitchen LORazepam (ATIVAN) 1 MG tablet Take 1 mg by mouth every 4 (four) hours as needed for anxiety.    Marland Kitchen oxyCODONE-acetaminophen (PERCOCET/ROXICET) 5-325 MG per tablet Take 2 tablets by mouth every 4 (four) hours as needed for severe pain.      No results found for this or any previous visit (from the past 48 hour(s)). No results found.  Review of Systems  Constitutional: Negative.   HENT: Negative.   Eyes: Negative.   Respiratory: Negative.   Cardiovascular: Negative.   Gastrointestinal: Negative.   Genitourinary: Negative.   Musculoskeletal: Negative.   Skin: Negative.   Neurological: Negative.   Endo/Heme/Allergies: Negative.    Psychiatric/Behavioral: Negative.     Blood pressure 110/70, pulse 79, temperature 97.9 F (36.6 C), resp. rate 20, SpO2 98 %. Physical Exam  Constitutional: She is oriented to person, place, and time. She appears well-developed and well-nourished. No distress.  HENT:  Head: Normocephalic and atraumatic.  Right Ear: External ear normal.  Left Ear: External ear normal.  Nose: Nose normal.  Mouth/Throat: Oropharynx is clear and moist. No oropharyngeal exudate.  Eyes: Conjunctivae and EOM are normal. Pupils are equal, round, and reactive to light. Right eye exhibits no discharge. Left eye exhibits no discharge.  Neck: Normal range of motion. Neck supple. No tracheal deviation present. No thyromegaly present.  Cardiovascular: Normal rate, regular rhythm, normal heart sounds and intact distal pulses.  Exam reveals no friction rub.   No murmur heard. Respiratory: Effort normal and breath sounds normal. No respiratory distress. She has no wheezes.  GI: Soft. Bowel sounds are normal. She exhibits no distension. There is no tenderness.  Musculoskeletal: Normal range of motion. She exhibits no edema or tenderness.  Neurological: She is alert and oriented to person, place, and time. She has normal reflexes. She displays normal reflexes. No cranial nerve deficit. She exhibits normal muscle tone. Coordination normal.  Skin: Skin is warm and dry. No rash noted. She is not diaphoretic. No erythema. No pallor.  Psychiatric: She has a normal mood and affect. Her behavior is normal. Judgment and thought content normal.     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 Plan L4-5 redo decompressive laminectomy and foraminotomies  followed by posterior lumbar interbody fusion utilizing interbody cages and local autographing coupled with posterior lateral arthrodesis utilizing nonsegmental cortical pedicle screw fixation. Risks and benefits of explained. Patient wishes to  proceed.  Teryn Gust A 04/28/2014, 7:41 AM

## 2014-04-28 NOTE — Op Note (Signed)
Date of procedure: 04/28/2014  Date of dictation: Same  Service: Neurosurgery  Preoperative diagnosis: L4-5 spondylosis with foraminal stenosis  Postoperative diagnosis: Same  Procedure Name: L4-5 decompressive laminectomy with bilateral L4 and L5 decompressive foraminotomies, more than would be required for simple interbody fusion alone.   L4-5 posterior lumbar interbody fusion utilizing interbody peek cage and local autographing  L4-5 posterior lateral arthrodesis utilizing nonsegmental cortical pedicle screw fixation and local autograft    Surgeon:Marty Sadlowski A.Lynnett Langlinais, M.D.  Asst. Surgeon: None  Anesthesia: General  Indication: 38 year old female with intractable back and lower extremity pain. Workup demonstrates evidence of marked disc degeneration with disc space collapse kyphosis and some assisted spondylosis causing foraminal stenosis. Patient is failed conservative management presents now for lumbar decompression and fusion.  Operative note: After induction of anesthesia, patient position prone onto Wilson frame and appropriately padded. Lumbar region prepped and draped. Incision made overlying L4-5. Dissection performed bilaterally. Retractor placed. Fluoroscopy used. Levels confirmed. Entry sites into the pedicle of L4 were obtained bilaterally along the superior aspect of the pars and articularis. Drill was then introduced in a lateral and cephalad manner under fluoroscopic guidance using continual neural monitoring. Cortical pedicle screws were then tapped and placed at both L4 and L5. Bilateral discectomies were then performed at L4-5. Disc space was then reamed and curetted aggressively. Disc spaces distracted up to 8 mm. On the right side an 8 x 23 x 8 cage was impacted into place and rotated in position. Morselized autograft was then packed from the left side and a second cage was inserted. Unfortunately during rotation of this cage the cage itself broke and required removal. After  removal of this cage and cage was impacted into place and rotated into position. Wounds and irrigated out like solution. Final images revealed good position the implants and the screws at the proper upper level with normal alignment of the spine. Short segment titanium rods and placed or the screw heads at L4 and L5. Locking caps and placed over the screws. Locking caps and engaged with the construct under slight compression. Lateral facet and transverse processes were decorticated using high-speed drill. Morselized autograft was packed posterior laterally for later fusion. Gelfoam was placed topically for hemostasis which be good. Vancomycin powder was placed the deep wound space. Wounds and close in layers. Steri-Strips and sterile dressing were applied. There were no apparent complications. The patient tolerated the procedure well and she returns to the recovery room postop.

## 2014-04-28 NOTE — Anesthesia Preprocedure Evaluation (Addendum)
Anesthesia Evaluation  Patient identified by MRN, date of birth, ID band Patient awake    Reviewed: Allergy & Precautions, H&P , NPO status , Patient's Chart, lab work & pertinent test results  Airway Mallampati: II  TM Distance: >3 FB Neck ROM: Full    Dental no notable dental hx. (+) Teeth Intact, Dental Advisory Given   Pulmonary neg pulmonary ROS,  breath sounds clear to auscultation  Pulmonary exam normal       Cardiovascular hypertension, Pt. on medications Rhythm:Regular Rate:Normal     Neuro/Psych Anxiety negative neurological ROS     GI/Hepatic negative GI ROS, Neg liver ROS,   Endo/Other  negative endocrine ROS  Renal/GU negative Renal ROS  negative genitourinary   Musculoskeletal   Abdominal   Peds  Hematology negative hematology ROS (+)   Anesthesia Other Findings   Reproductive/Obstetrics negative OB ROS                            Anesthesia Physical Anesthesia Plan  ASA: II  Anesthesia Plan: General   Post-op Pain Management:    Induction: Intravenous  Airway Management Planned: Oral ETT  Additional Equipment:   Intra-op Plan:   Post-operative Plan: Extubation in OR  Informed Consent: I have reviewed the patients History and Physical, chart, labs and discussed the procedure including the risks, benefits and alternatives for the proposed anesthesia with the patient or authorized representative who has indicated his/her understanding and acceptance.   Dental advisory given  Plan Discussed with: CRNA  Anesthesia Plan Comments:         Anesthesia Quick Evaluation

## 2014-04-29 ENCOUNTER — Encounter (HOSPITAL_COMMUNITY): Payer: Self-pay | Admitting: Neurosurgery

## 2014-04-29 MED ORDER — DIAZEPAM 5 MG PO TABS: 5.0000 mg | ORAL_TABLET | Freq: Four times a day (QID) | ORAL | Status: DC | PRN

## 2014-04-29 MED ORDER — OXYCODONE-ACETAMINOPHEN 10-325 MG PO TABS: 1.0000 | ORAL_TABLET | ORAL | Status: DC | PRN

## 2014-04-29 NOTE — Progress Notes (Signed)
OT Cancellation Note  Patient Details Name: Jesenya Bowditch MRN: [id number] DOB: 1975-06-17   Cancelled Treatment:    Reason Eval/Treat Not Completed: Other (comment). Spoke with pt and she feels good about information covered yesterday and did not feel need to practice. OT signing off.  Benito Mccreedy OTR/L [phone] 04/29/2014, 9:29 AM

## 2014-04-29 NOTE — Progress Notes (Signed)
Pt doing well. Pt and husband given D/C instructions with Rx's, verbal understanding was provided. Pt's incision is clean and dry. Pt received 3-n-1 prior to D/C from [place]. Pt D/C'd home via wheelchair @ 1150 per MD order. Pt is stable @ D/C and has no other needs at this time. Holli Humbles, RN

## 2014-04-29 NOTE — Discharge Instructions (Signed)
Wound Care °Keep incision covered and dry for two days.  If you shower, cover incision with plastic wrap.  °Do not put any creams, lotions, or ointments on incision. °Leave steri-strips on back.  They will fall off by themselves. °Activity °Walk each and every day, increasing distance each day. °No lifting greater than 5 lbs.  Avoid excessive neck motion. °No driving for 2 weeks; may ride as a passenger locally. °If provided with back brace, wear when out of bed.  It is not necessary to wear brace in bed. °Diet °Resume your normal diet.  °Return to Work °Will be discussed at you follow up appointment. °Call Your Doctor If Any of These Occur °Redness, drainage, or swelling at the wound.  °Temperature greater than 101 degrees. °Severe pain not relieved by pain medication. °Incision starts to come apart. °Follow Up Appt °Call today for appointment in 1-2 weeks ([phone]) or for problems.  If you have any hardware placed in your spine, you will need an x-ray before your appointment. ° ° °Spinal Fusion °Spinal fusion is a procedure to make 2 or more of the bones in your spinal column (vertebrae) grow together (fuse). This procedure stops movement between the vertebrae and can relieve pain and prevent deformity.  °Spinal fusion is used to treat the following conditions: °· Fractures of the spine. °· Herniated disk (the spongy material [cartilage] between the vertebrae). °· Abnormal curvatures of the spine, such as scoliosis or kyphosis. °· A weak or an unstable spine, caused by infections or tumor. °RISKS AND COMPLICATIONS °Complications associated with spinal fusion are rare, but they can occur. Possible complications include: °· Bleeding. °· Infection near the incision. °· Nerve damage. Signs of nerve damage are back pain, pain in one or both legs, weakness, or numbness. °· Spinal fluid leakage. °· Blood clot in your leg, which can move to your lungs. °· Difficulty controlling urination or bowel movements. °BEFORE THE  PROCEDURE °· A medical evaluation will be done. This will include a physical exam, blood tests, and imaging exams. °· You will talk with an anesthesiologist. This is the person who will be in charge of the anesthesia during the procedure. Spinal fusion usually requires that you are asleep during the procedure (general anesthesia). °· You will need to stop taking certain medicines, particularly those associated with an increased risk of bleeding. Ask your caregiver about changing or stopping your regular medicines. °· If you smoke, you will need to stop at least 2 weeks before the procedure. Smoking can slow down the healing process, especially fusion of the vertebrae, and increase the risk of complications. °· Do not eat or drink anything for at least 8 hours before the procedure. °PROCEDURE  °A cut (incision) is made over the vertebrae that will be fused. The back muscles are separated from the vertebrae. If you are having this procedure to treat a herniated disk, the disc material pressing on the nerve root is removed (decompression). The area where the disk is removed is then filled with extra bone. Bone from another part of your body (autogenous bone) or bone from a bone donor (allograft bone) may be used. The extra bone promotes fusion between the vertebrae. Sometimes, specific medicines are added to the fusion area to promote bone healing. In most cases, screws and rods or metal plates will be used to attach the vertebrae to stabilize them while they fuse.  °AFTER THE PROCEDURE  °· You will stay in a recovery area until the anesthesia has worn   off. Your blood pressure and pulse will be checked frequently. °· You will be given antibiotics to prevent infection. °· You may continue to receive fluids through an intravenous (IV) tube while you are still in the hospital. °· Pain after surgery is normal. You will be given pain medicine. °· You will be taught how to move correctly and how to stand and walk. While in  bed, you will be instructed to turn frequently, using a "log rolling" technique, in which the entire body is moved without twisting the back. °Document Released: 01/14/2003 Document Revised: 07/10/2011 Document Reviewed: 06/30/2010 °[employer]® Patient Information ©2015 [employer]. This information is not intended to replace advice given to you by your health care provider. Make sure you discuss any questions you have with your health care provider. ° °

## 2014-04-29 NOTE — Discharge Summary (Signed)
Physician Discharge Summary  Patient ID: Jackie Leonard MRN: [id number] DOB/AGE: 1975/08/12 38 y.o.  Admit date: 04/28/2014 Discharge date: 04/29/2014  Admission Diagnoses:  Discharge Diagnoses:  Active Problems:   DDD (degenerative disc disease), lumbar   Discharged Condition: good  Hospital Course: The patient was admitted where she underwent an uncomplicated C1-3 decompression and fusion. Postoperatively she is doing well. Preoperative back and leg pain improving. Patient up ambulating without difficulty. Ready for discharge home.  Consults:   Significant Diagnostic Studies:   Treatments:   Discharge Exam: Blood pressure 108/68, pulse 86, temperature 98.1 F (36.7 C), temperature source Oral, resp. rate 16, SpO2 98 %. Awake and alert. Oriented and appropriate. Motor and sensory function intact. Wound clean and dry. Chest and abdomen benign.   Disposition:      Medication List    STOP taking these medications        oxyCODONE-acetaminophen 5-325 MG per tablet  Commonly known as:  PERCOCET/ROXICET  Replaced by:  oxyCODONE-acetaminophen 10-325 MG per tablet      TAKE these medications        desvenlafaxine 100 MG 24 hr tablet  Commonly known as:  PRISTIQ  Take 100 mg by mouth daily.     diazepam 5 MG tablet  Commonly known as:  VALIUM  Take 1-2 tablets (5-10 mg total) by mouth every 6 (six) hours as needed for muscle spasms.     lisdexamfetamine 60 MG capsule  Commonly known as:  VYVANSE  Take 60 mg by mouth every morning.     lisinopril-hydrochlorothiazide 20-12.5 MG per tablet  Commonly known as:  PRINZIDE,ZESTORETIC  Take 1 tablet by mouth daily.     LORazepam 1 MG tablet  Commonly known as:  ATIVAN  Take 1 mg by mouth every 4 (four) hours as needed for anxiety.     oxyCODONE-acetaminophen 10-325 MG per tablet  Commonly known as:  PERCOCET  Take 1-2 tablets by mouth every 4 (four) hours as needed for pain.           Follow-up Information     Follow up with Charlie Pitter, MD.   Specialty:  Neurosurgery   Contact information:   [address] [place] [address] [phone]       Signed: Charlie Pitter 04/29/2014, 9:25 AM

## 2014-12-03 IMAGING — US US SOFT TISSUE HEAD/NECK
1 series · 14 of 25 positions shown · non-contrast
Comparison: None.

CLINICAL DATA: Hypothyroidism.  Possible thyroid nodule palpated by
physical examination.

THYROID ULTRASOUND
TECHNIQUE: Ultrasound examination of the thyroid gland and adjacent
soft tissues was performed.

[Series 1: us soft tissue head/neck · 0.08mm/px · 14 of 35 slices shown]
[im 1/35]
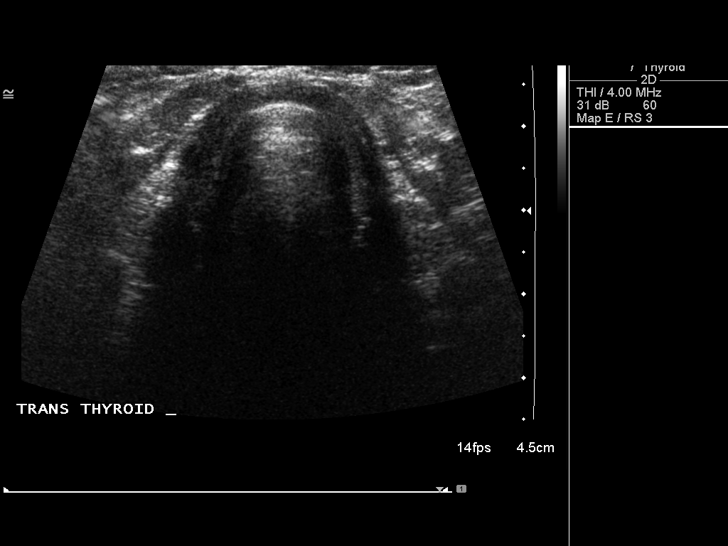
[im 3/35]
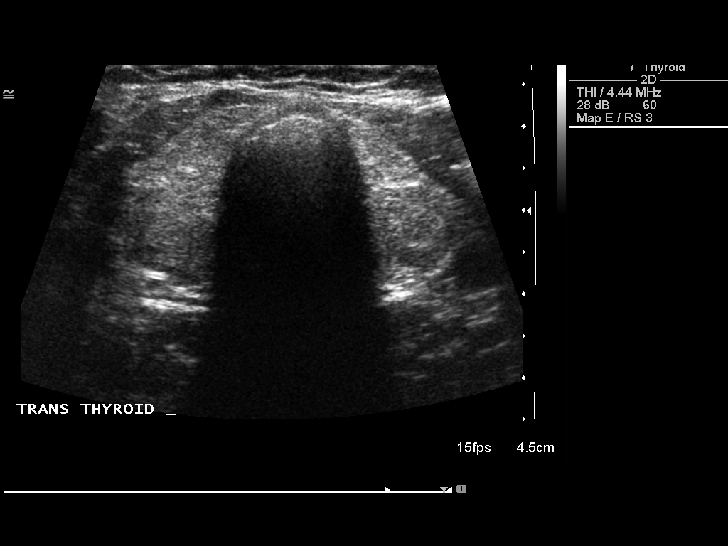
[im 6/35]
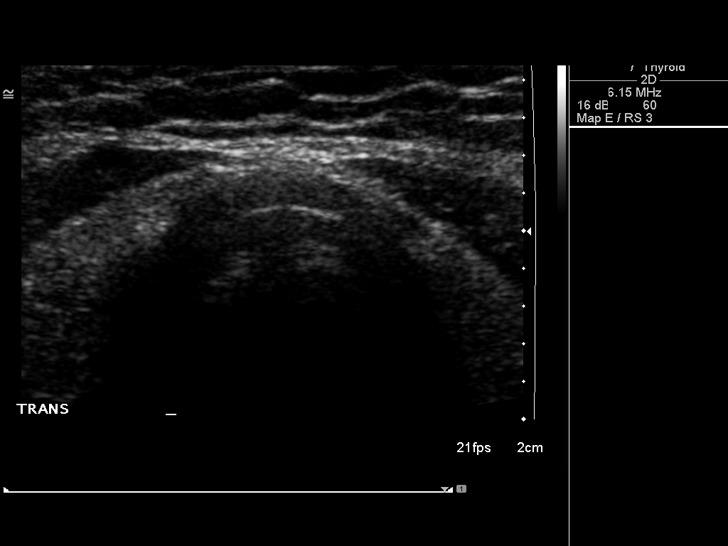
[im 9/35]
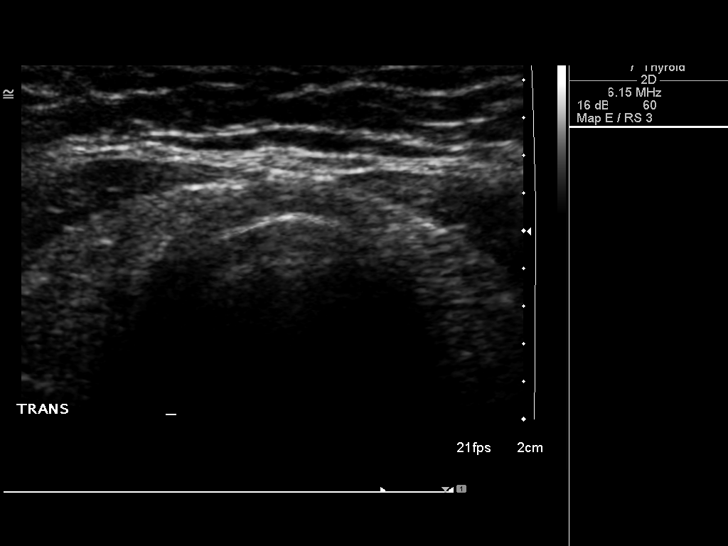
[im 12/35]
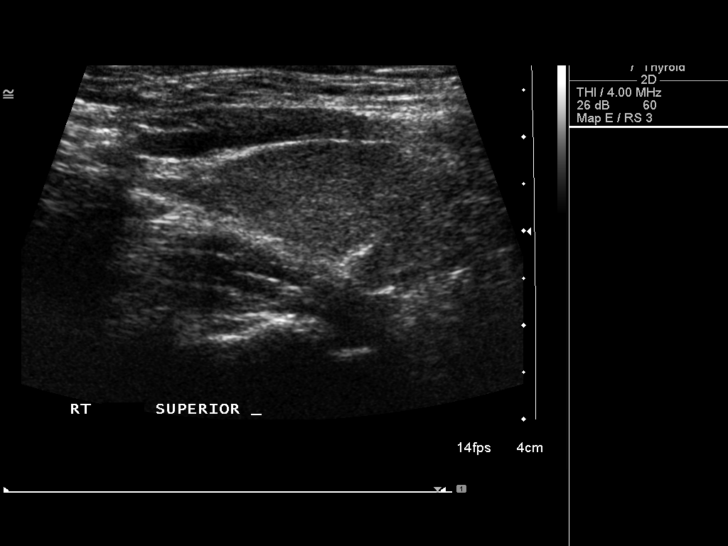
[im 13/35]
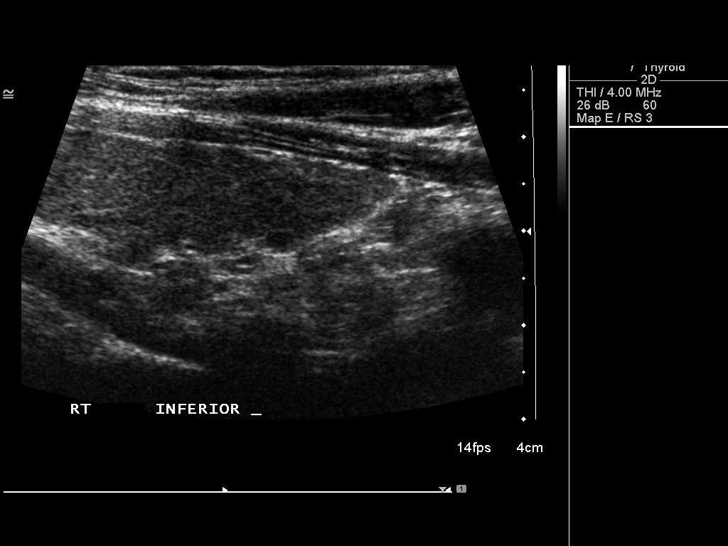
[im 16/35]
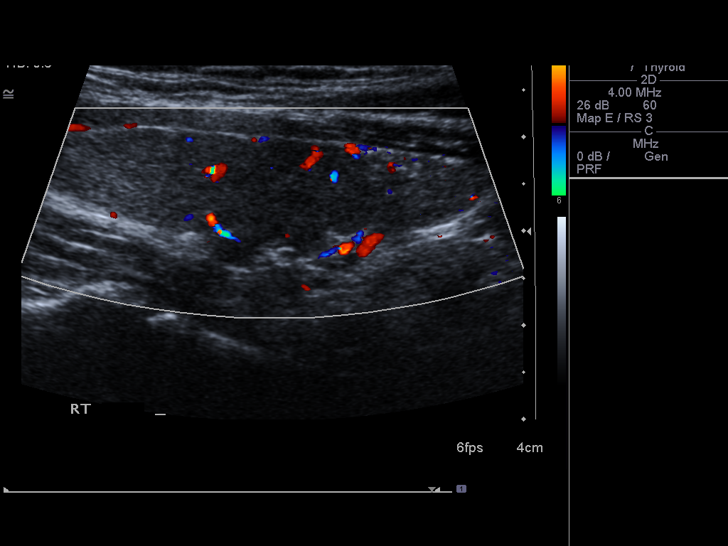
[im 19/35]
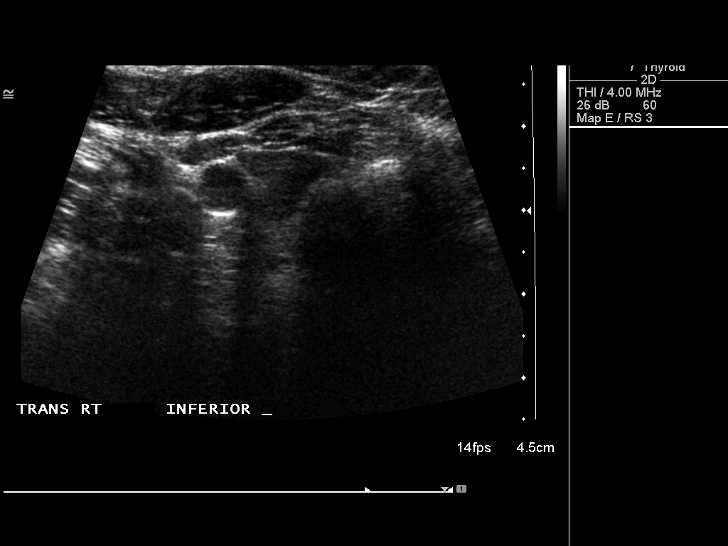
[im 22/35]
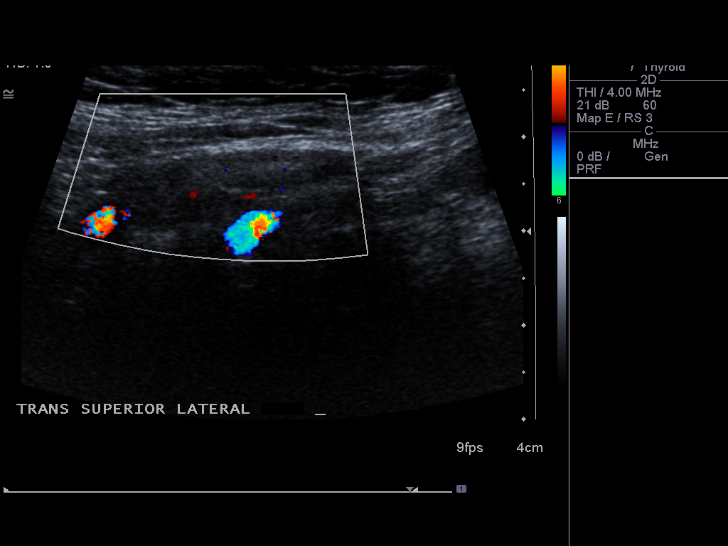
[im 23/35]
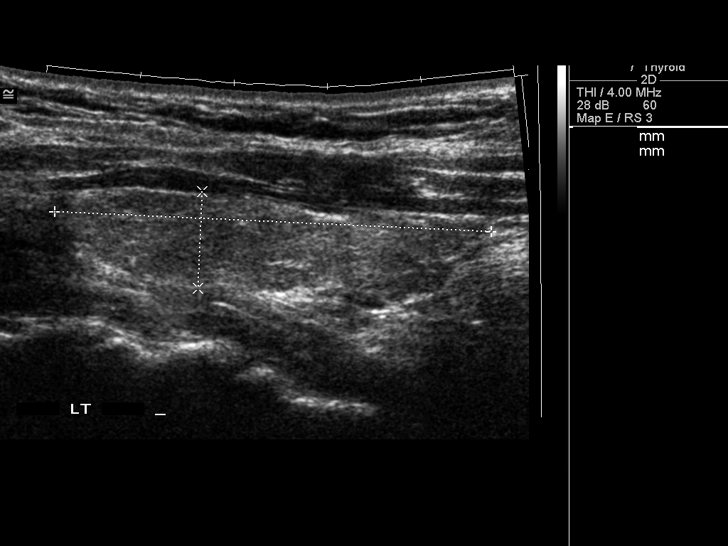
[im 26/35]
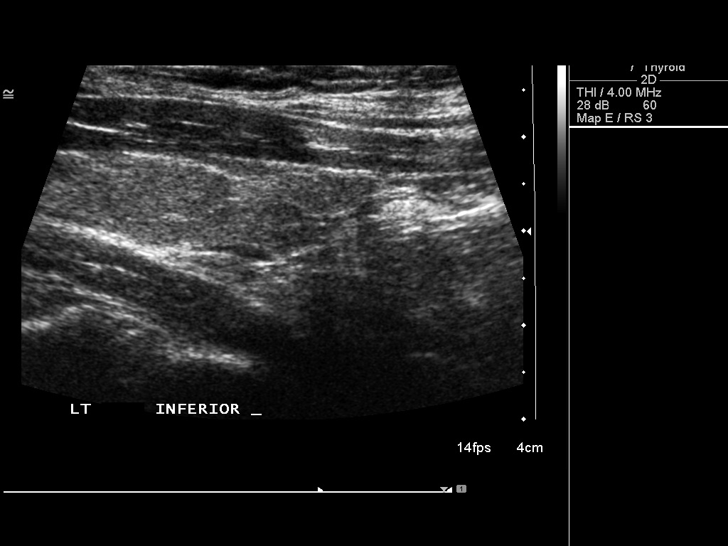
[im 29/35]
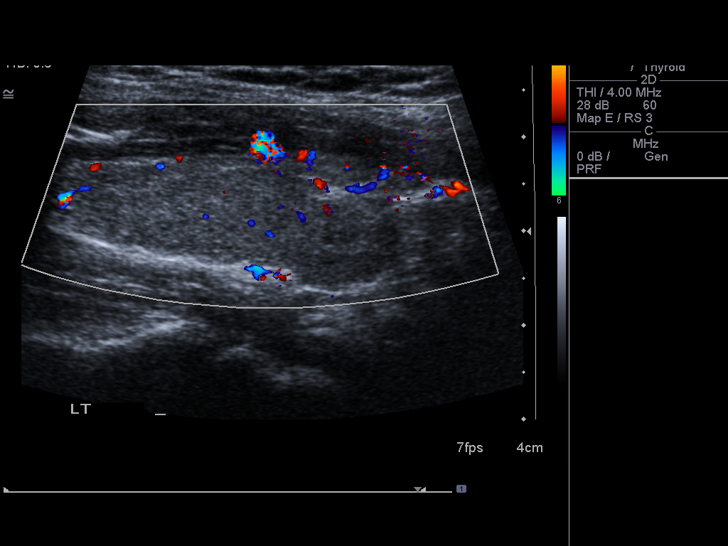
[im 32/35]
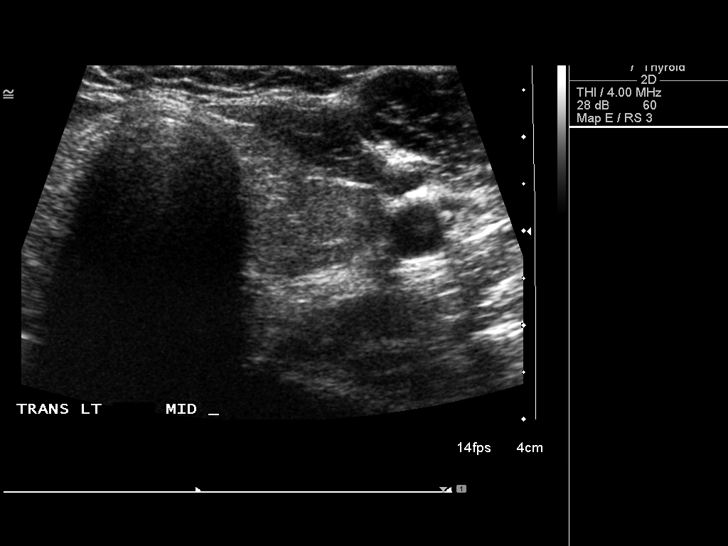
[im 35/35]
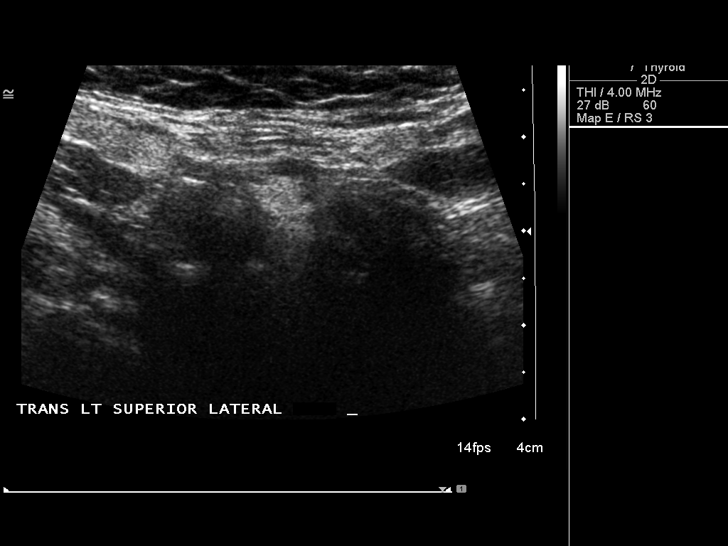

[14 of 25 positions shown; findings below may reference images not displayed]

FINDINGS: Right thyroid lobe:  5.1 x 1.5 x 1.7 cm
Left thyroid lobe:  4.7 x 1.0 x 1.5 cm
Isthmus:  0.1 cm

Thyroid echotexture and volume are normal.

Focal nodules:  None

Lymphadenopathy:  None visualized.
IMPRESSION: Normal thyroid ultrasound.

## 2016-07-24 IMAGING — RF DG LUMBAR SPINE 2-3V
1 series · 2 of 2 positions shown · non-contrast
Comparison: None.

CLINICAL DATA: 38-year-old for posterior lumbar intervertebral

EXAM:
LUMBAR SPINE - 2-3 VIEW

[Series 1: run · 2 of 2 slices shown]
[im 1/2]
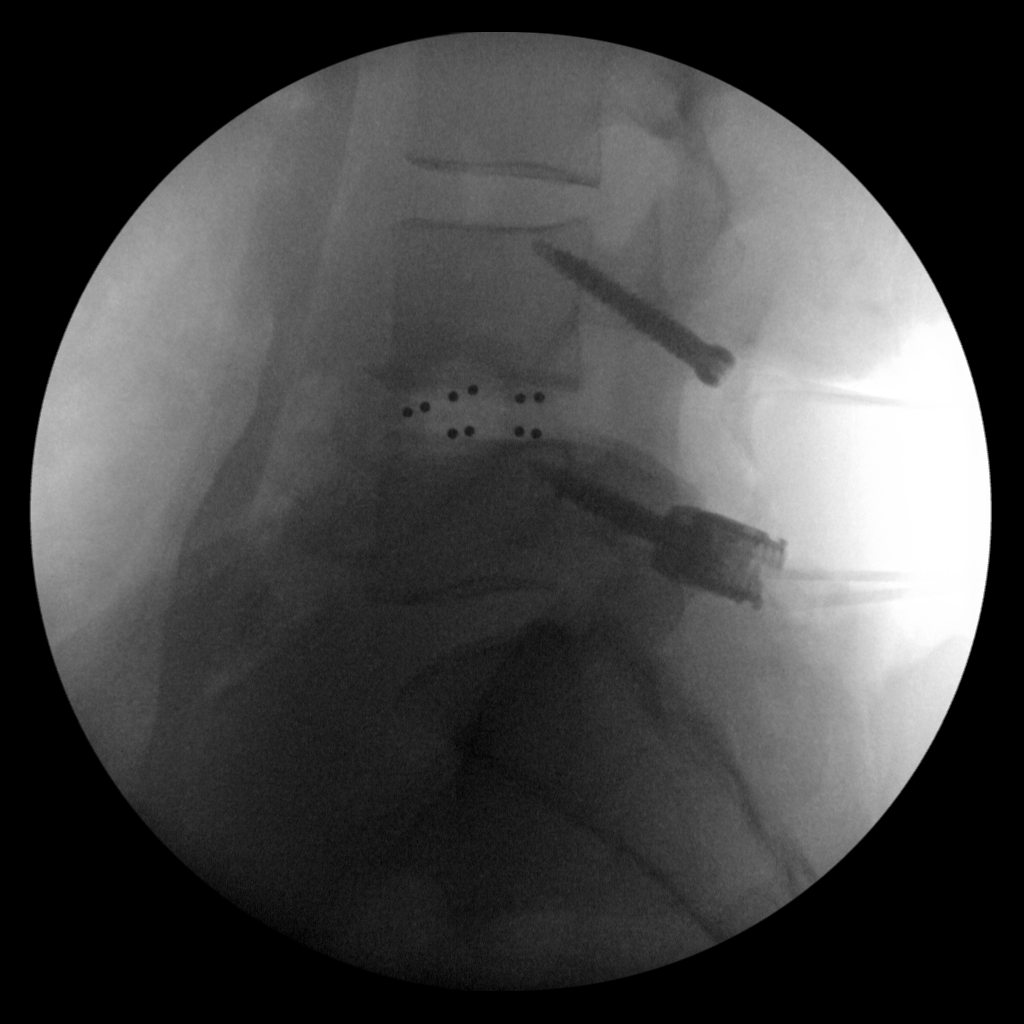
[im 2/2]
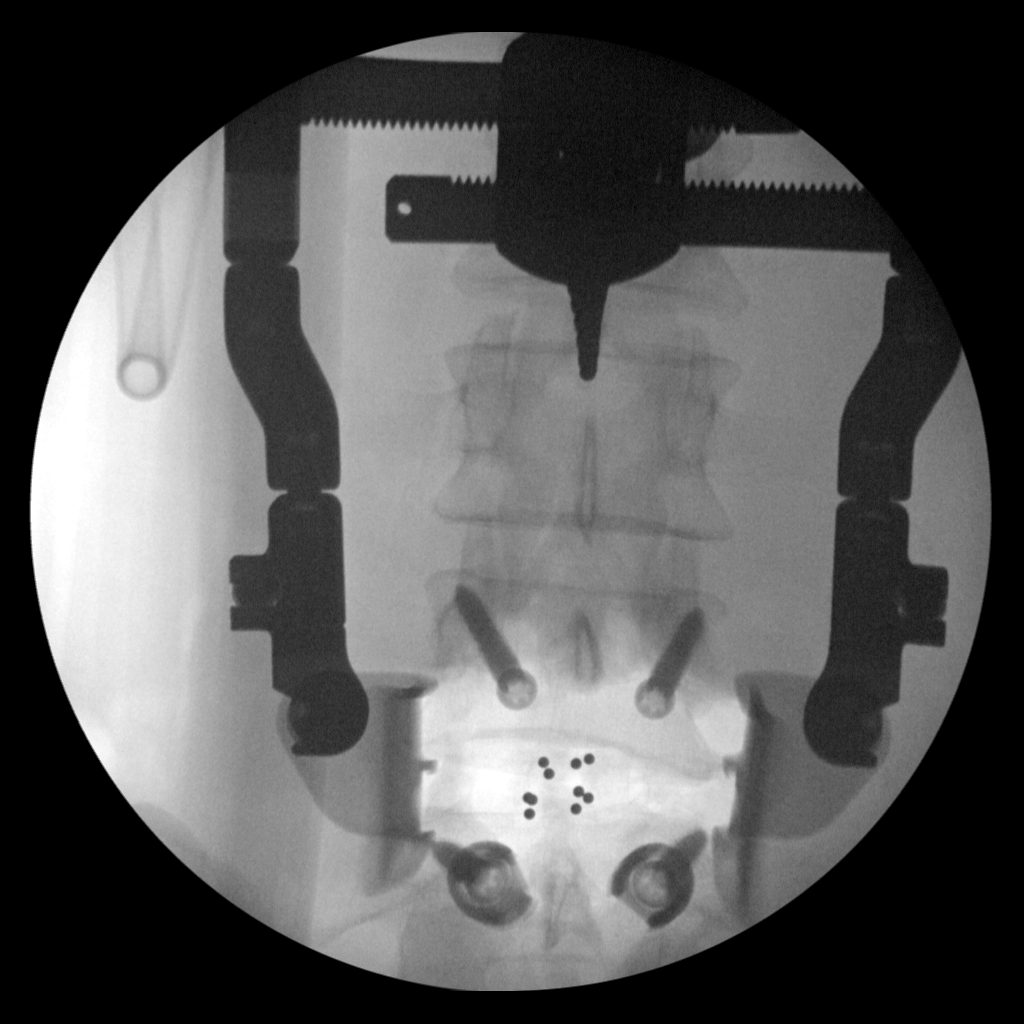

[2 of 2 positions shown; findings below may reference images not displayed]

FINDINGS: Spot intraoperative fluoroscopic images demonstrate the presence of
metallic hardware and intervertebral fusion material at the L4-L5
level. Posterior lumbar screw are also present at the L4-L5 level.
Anterior osteophytes are present at the L4-L5 level.
IMPRESSION: Intraoperative images demonstrating intervertebral fusion at the
L4-L5 level.

## 2017-11-26 ENCOUNTER — Ambulatory Visit: Payer: Self-pay | Admitting: Family Medicine

## 2017-11-26 VITALS — BP 105/75 | HR 80 | Temp 98.4°F | Resp 16 | Wt 168.4 lb

## 2017-11-26 DIAGNOSIS — T7840XA Allergy, unspecified, initial encounter: Secondary | ICD-10-CM

## 2017-11-26 MED ORDER — CETIRIZINE HCL 10 MG PO TABS: 10.0000 mg | ORAL_TABLET | Freq: Every day | ORAL | 0 refills | Status: DC

## 2017-11-26 MED ORDER — PREDNISONE 10 MG (21) PO TBPK: ORAL_TABLET | ORAL | 0 refills | Status: DC

## 2017-11-26 MED ORDER — METHYLPREDNISOLONE SODIUM SUCC 125 MG IJ SOLR
125.0000 mg | Freq: Once | INTRAMUSCULAR | Status: AC
  Administered 2017-11-26: 125 mg via INTRAMUSCULAR

## 2017-11-26 NOTE — Patient Instructions (Addendum)
PLAN< Follow up with provider in 2-3 days Follow up in ED for worsening symptoms Take all medications as prescribed. Stop taking Lisinopril until cleared to restart. Use resources provided to find PCP. May follow up at [facility] if unable to find care in next 2-3 days.  Angioedema Angioedema is sudden swelling in the body. The swelling can happen in any part of the body. It often happens on the skin and causes itchy, bumpy patches (hives) to form. This condition may:  Happen only one time.  Happen more than one time. It may come back at random times.  Keep coming back for a number of years. Someday it may stop coming back.  Follow these instructions at home:  Take over-the-counter and prescription medicines only as told by your doctor.  If you were given medicines for emergency allergy treatment, always carry them with you.  Wear a medical bracelet as told by your doctor.  Avoid the things that cause your attacks (triggers).  If this condition was passed to you from your parents and you want to have kids, talk to your doctor. Your kids may also have this condition. Contact a doctor if:  You have another attack.  Your attacks happen more often, even after you take steps to prevent them.  This condition was passed to you by your parents and you want to have kids. Get help right away if:  Your mouth, tongue, or lips get very swollen.  You have trouble breathing.  You have trouble swallowing.  You pass out (faint). This information is not intended to replace advice given to you by your health care provider. Make sure you discuss any questions you have with your health care provider. Document Released: 04/05/2009 Document Revised: 11/17/2015 Document Reviewed: 10/26/2015 Elsevier Interactive Patient Education  2018 [place] (urticaria) are itchy, red, swollen areas on your skin. Hives can appear on any part of your body and can vary in size. They can be  as small as the tip of a pen or much larger. Hives often fade within 24 hours (acute hives). In other cases, new hives appear after old ones fade. This cycle can continue for several days or weeks (chronic hives). Hives result from your body's reaction to an irritant or to something that you are allergic to (trigger). When you are exposed to a trigger, your body releases a chemical (histamine) that causes redness, itching, and swelling. You can get hives immediately after being exposed to a trigger or hours later. Hives do not spread from person to person (are not contagious). Your hives may get worse with scratching, exercise, and emotional stress. What are the causes? Causes of this condition include:  Allergies to certain foods or ingredients.  Insect bites or stings.  Exposure to pollen or pet dander.  Contact with latex or chemicals.  Spending time in sunlight, heat, or cold (exposure).  Exercise.  Stress.  You can also get hives from some medical conditions and treatments. These include:  Viruses, including the common cold.  Bacterial infections, such as urinary tract infections and strep throat.  Disorders such as vasculitis, lupus, or thyroid disease.  Certain medications.  Allergy shots.  Blood transfusions.  Sometimes, the cause of hives is not known (idiopathic hives). What increases the risk? This condition is more likely to develop in:  Women.  People who have food allergies, especially to citrus fruits, milk, eggs, peanuts, tree nuts, or shellfish.  People who are allergic to: ? Medicines. ? Latex. ?  Insects. ? Animals. ? Pollen.  People who have certain medical conditions, includinglupus or thyroid disease.  What are the signs or symptoms? The main symptom of this condition is raised, itchyred or white bumps or patches on your skin. These areas may:  Become large and swollen (welts).  Change in shape and location, quickly and repeatedly.  Be  separate hives or connect over a large area of skin.  Sting or become painful.  Turn white when pressed in the center (blanch).  In severe cases, yourhands, feet, and face may also become swollen. This may occur if hives develop deeper in your skin. How is this diagnosed? This condition is diagnosed based on your symptoms, medical history, and physical exam. Your skin, urine, or blood may be tested to find out what is causing your hives and to rule out other health issues. Your health care provider may also remove a small sample of skin from the affected area and examine it under a microscope (biopsy). How is this treated? Treatment depends on the severity of your condition. Your health care provider may recommend using cool, wet cloths (cool compresses) or taking cool showers to relieve itching. Hives are sometimes treated with medicines, including:  Antihistamines.  Corticosteroids.  Antibiotics.  An injectable medicine (omalizumab). Your health care provider may prescribe this if you have chronic idiopathic hives and you continue to have symptoms even after treatment with antihistamines.  Severe cases may require an emergency injection of adrenaline (epinephrine) to prevent a life-threatening allergic reaction (anaphylaxis). Follow these instructions at home: Medicines  Take or apply over-the-counter and prescription medicines only as told by your health care provider.  If you were prescribed an antibiotic medicine, use it as told by your health care provider. Do not stop taking the antibiotic even if you start to feel better. Skin Care  Apply cool compresses to the affected areas.  Do not scratch or rub your skin. General instructions  Do not take hot showers or baths. This can make itching worse.  Do not wear tight-fitting clothing.  Use sunscreen and wear protective clothing when you are outside.  Avoid any substances that cause your hives. Keep a journal to help you  track what causes your hives. Write down: ? What medicines you take. ? What you eat and drink. ? What products you use on your skin.  Keep all follow-up visits as told by your health care provider. This is important. Contact a health care provider if:  Your symptoms are not controlled with medicine.  Your joints are painful or swollen. Get help right away if:  You have a fever.  You have pain in your abdomen.  Your tongue or lips are swollen.  Your eyelids are swollen.  Your chest or throat feels tight.  You have trouble breathing or swallowing. These symptoms may represent a serious problem that is an emergency. Do not wait to see if the symptoms will go away. Get medical help right away. Call your local emergency services (911 in the [place] Do not drive yourself to the hospital. This information is not intended to replace advice given to you by your health care provider. Make sure you discuss any questions you have with your health care provider. Document Released: 04/17/2005 Document Revised: 09/15/2015 Document Reviewed: 02/03/2015 Elsevier Interactive Patient Education  2018 [employer].

## 2017-11-26 NOTE — Progress Notes (Signed)
Jackie Leonard is a 42 y.o. female who presents today with concerns of allergic reaction. She reports persistent hives on her body for the last 3 weeks. She denies any known triggers and reports she has been using benadryl for her symptoms.  Review of Systems  Constitutional: Negative for chills, fever and malaise/fatigue.  HENT: Negative for congestion, ear discharge, ear pain, sinus pain and sore throat.   Eyes: Negative.   Respiratory: Negative for cough, sputum production and shortness of breath.   Cardiovascular: Negative.  Negative for chest pain.  Gastrointestinal: Negative for abdominal pain, diarrhea, nausea and vomiting.  Genitourinary: Negative for dysuria, frequency, hematuria and urgency.  Musculoskeletal: Negative for myalgias.  Skin: Positive for itching and rash.       Lip swelling on the right side of face- no SOB ability to speak and swallow intact  Neurological: Negative for headaches.  Endo/Heme/Allergies: Negative.   Psychiatric/Behavioral: Negative.     O: Vitals:   11/26/17 1908  BP: 105/75  Pulse: 80  Resp: 16  Temp: 98.4 F (36.9 C)  SpO2: 99%     Physical Exam  Constitutional: She is oriented to person, place, and time. Vital signs are normal. She appears well-developed and well-nourished. She is active.  Non-toxic appearance. She does not have a sickly appearance.  HENT:  Head: Normocephalic.  Right Ear: Hearing, tympanic membrane, external ear and ear canal normal.  Left Ear: Hearing, tympanic membrane, external ear and ear canal normal.  Nose: Nose normal.  Mouth/Throat: Uvula is midline and oropharynx is clear and moist.    Facial swelling limited to the right side of the face lips only- oropharynx intact without edema, swelling or erythema.  Neck: Normal range of motion. Neck supple.  Cardiovascular: Normal rate, regular rhythm, normal heart sounds and normal pulses.  Pulmonary/Chest: Effort normal and breath sounds normal.  LUNGS CTA ALL  FIELDS  Abdominal: Soft. Bowel sounds are normal.  Musculoskeletal: Normal range of motion.  Lymphadenopathy:       Head (right side): No submental and no submandibular adenopathy present.       Head (left side): No submental and no submandibular adenopathy present.    She has no cervical adenopathy.  Neurological: She is alert and oriented to person, place, and time.  Skin:     Psychiatric: She has a normal mood and affect.  Vitals reviewed.    A: 1. Allergic reaction, initial encounter   A   P: Discussed exam findings, diagnosis etiology and medication use and indications reviewed with patient. Follow- Up and discharge instructions provided. No emergent/urgent issues found on exam.  Patient verbalized understanding of information provided and agrees with plan of care (POC), all questions answered.  PLAN< Follow up with provider in 2-3 days Follow up in ED for worsening symptoms Take all medications as prescribed. Stop taking Lisinopril until cleared to restart. Use resources provided to find PCP. May follow up at [facility] if unable to find care in next 2-3 days. No ability to predict trigger of symptoms based on history and physical exam. Suspect possibility of angioedema caused by Lisinopril- patient advised to stop medication for now. She was also advised to seek care in primary care for benefit of a comprehensive work-up and lab evaluation.          1. Allergic reaction, initial encounter - methylPREDNISolone sodium succinate (SOLU-MEDROL) 125 mg/2 mL injection 125 mg - predniSONE (STERAPRED UNI-PAK 21 TAB) 10 MG (21) TBPK tablet; Take 6 tablets day 1  and then one less tablet each day... (6,5,4,3,2,1). Take tablets in the am with food. - cetirizine (ZYRTEC) 10 MG tablet; Take 1 tablet (10 mg total) by mouth at bedtime.  Other orders - diphenhydrAMINE (BENADRYL) 50 MG capsule; Take 50 mg by mouth every 6 (six) hours as needed.

## 2018-02-13 ENCOUNTER — Encounter (HOSPITAL_COMMUNITY): Payer: Self-pay

## 2018-02-13 ENCOUNTER — Other Ambulatory Visit: Payer: Self-pay

## 2018-02-13 ENCOUNTER — Emergency Department (HOSPITAL_COMMUNITY)
Admission: EM | Admit: 2018-02-13 | Discharge: 2018-02-13 | Disposition: A | Discharge status: Home / Self Care | Payer: 59 | Attending: Emergency Medicine | Admitting: Emergency Medicine

## 2018-02-13 DIAGNOSIS — T7840XA Allergy, unspecified, initial encounter: Secondary | ICD-10-CM | POA: Insufficient documentation

## 2018-02-13 DIAGNOSIS — I10 Essential (primary) hypertension: Secondary | ICD-10-CM | POA: Diagnosis not present

## 2018-02-13 DIAGNOSIS — Z79899 Other long term (current) drug therapy: Secondary | ICD-10-CM | POA: Insufficient documentation

## 2018-02-13 LAB — CBC WITH DIFFERENTIAL/PLATELET
Abs Immature Granulocytes: 0.01 10*3/uL (ref 0.00–0.07)
Basophils Absolute: 0 10*3/uL (ref 0.0–0.1)
Basophils Relative: 1 %
Eosinophils Absolute: 0.2 10*3/uL (ref 0.0–0.5)
Eosinophils Relative: 5 %
HEMATOCRIT: 40.8 % (ref 36.0–46.0)
HEMOGLOBIN: 14.2 g/dL (ref 12.0–15.0)
Immature Granulocytes: 0 %
LYMPHS ABS: 1.2 10*3/uL (ref 0.7–4.0)
LYMPHS PCT: 25 %
MCH: 34.1 pg — ABNORMAL HIGH (ref 26.0–34.0)
MCHC: 34.8 g/dL (ref 30.0–36.0)
MCV: 97.8 fL (ref 80.0–100.0)
MONO ABS: 0.5 10*3/uL (ref 0.1–1.0)
MONOS PCT: 10 %
Neutro Abs: 3 10*3/uL (ref 1.7–7.7)
Neutrophils Relative %: 59 %
Platelets: 298 10*3/uL (ref 150–400)
RBC: 4.17 MIL/uL (ref 3.87–5.11)
RDW: 11.7 % (ref 11.5–15.5)
WBC: 4.9 10*3/uL (ref 4.0–10.5)
nRBC: 0 % (ref 0.0–0.2)

## 2018-02-13 LAB — COMPREHENSIVE METABOLIC PANEL
ALK PHOS: 57 U/L (ref 38–126)
ALT: 15 U/L (ref 0–44)
AST: 17 U/L (ref 15–41)
Albumin: 4.6 g/dL (ref 3.5–5.0)
Anion gap: 9 (ref 5–15)
BUN: 10 mg/dL (ref 6–20)
CALCIUM: 9 mg/dL (ref 8.9–10.3)
CO2: 25 mmol/L (ref 22–32)
CREATININE: 0.69 mg/dL (ref 0.44–1.00)
Chloride: 105 mmol/L (ref 98–111)
Glucose, Bld: 112 mg/dL — ABNORMAL HIGH (ref 70–99)
Potassium: 3.4 mmol/L — ABNORMAL LOW (ref 3.5–5.1)
Sodium: 139 mmol/L (ref 135–145)
Total Bilirubin: 0.8 mg/dL (ref 0.3–1.2)
Total Protein: 7.3 g/dL (ref 6.5–8.1)

## 2018-02-13 MED ORDER — EPINEPHRINE 0.3 MG/0.3ML IJ SOAJ: 0.3000 mg | Freq: Once | INTRAMUSCULAR | 0 refills | Status: AC

## 2018-02-13 MED ORDER — METHYLPREDNISOLONE SODIUM SUCC 125 MG IJ SOLR
125.0000 mg | Freq: Once | INTRAMUSCULAR | Status: AC
  Administered 2018-02-13: 125 mg via INTRAVENOUS
  Filled 2018-02-13: qty 2

## 2018-02-13 MED ORDER — FAMOTIDINE IN NACL 20-0.9 MG/50ML-% IV SOLN
20.0000 mg | Freq: Once | INTRAVENOUS | Status: AC
  Administered 2018-02-13: 20 mg via INTRAVENOUS
  Filled 2018-02-13: qty 50

## 2018-02-13 MED ORDER — EPINEPHRINE 0.3 MG/0.3ML IJ SOAJ
0.3000 mg | Freq: Once | INTRAMUSCULAR | Status: DC
  Filled 2018-02-13: qty 0.3

## 2018-02-13 NOTE — ED Provider Notes (Signed)
[facility] DEPT Provider Note   CSN: [id number] Arrival date & time: 02/13/18  0645     History   Chief Complaint Chief Complaint  Patient presents with  . Allergic Reaction    HPI Jackie Leonard is a 42 y.o. female.  The history is provided by the patient and medical records. No language interpreter was used.   Jackie Leonard is a 42 y.o. female who presents to the Emergency Department complaining of difficulty swallowing and feeling as if her throat is swelling since she woke up this morning.  She also endorses a hoarse voice.  She was seen by the allergist at the end of July where she was diagnosed with allergic reaction thought to be due to her lisinopril.  She was having hives diffusely as well as swelling to her right upper and lower lips.  She has had some intermittent facial swelling several times in the interim.  This morning, when she awoke with a hoarse voice and feeling as if her throat was swelling, she reported to the emergency department as she has never experienced these symptoms in the past.  She has not taken lisinopril or any other ACE/arb medications since the end of July.  She even quit taking ibuprofen last month for concerns that this may be the culprit.  She did take 2 Benadryl prior to arrival with some relief.  Denies any trouble breathing or wheezing.  No abdominal pain or diarrhea.  No confusion.  Past Medical History:  Diagnosis Date  . Anxiety   . Hypertension     Patient Active Problem List   Diagnosis Date Noted  . DDD (degenerative disc disease), lumbar 04/28/2014    Past Surgical History:  Procedure Laterality Date  . APPENDECTOMY  2011  . BACK SURGERY  2015  . BREAST REDUCTION SURGERY  1998  . CESAREAN SECTION    . MAXIMUM ACCESS (MAS)POSTERIOR LUMBAR INTERBODY FUSION (PLIF) 1 LEVEL N/A 04/28/2014   Procedure: FOR MAXIMUM ACCESS (MAS) POSTERIOR LUMBAR INTERBODY FUSION (PLIF) 1 LEVEL;  Surgeon: Charlie Pitter,  MD;  Location: [facility];  Service: Neurosurgery;  Laterality: N/A;  FOR MAXIMUM ACCESS (MAS) POSTERIOR LUMBAR INTERBODY FUSION (PLIF) 1 LEVEL LUMBAR 4-5     OB History   None      Home Medications    Prior to Admission medications   Medication Sig Start Date End Date Taking? Authorizing Provider  cetirizine (ZYRTEC) 10 MG tablet Take 1 tablet (10 mg total) by mouth at bedtime. 11/26/17  Yes Shella Maxim, NP  desvenlafaxine (PRISTIQ) 100 MG 24 hr tablet Take 100 mg by mouth at bedtime.    Yes [provider]  diphenhydrAMINE (BENADRYL) 50 MG capsule Take 50 mg by mouth every 6 (six) hours as needed.   Yes [provider]  hydrochlorothiazide (HYDRODIURIL) 25 MG tablet Take 25 mg by mouth daily.   Yes [provider]  lisdexamfetamine (VYVANSE) 60 MG capsule Take 60 mg by mouth every morning.   Yes [provider]  LORazepam (ATIVAN) 1 MG tablet Take 1 mg by mouth every 4 (four) hours as needed for anxiety.   Yes [provider]  EPINEPHrine 0.3 mg/0.3 mL IJ SOAJ injection Inject 0.3 mLs (0.3 mg total) into the muscle once for 1 dose. 02/13/18 02/13/18  Ward, Ozella Almond, PA-C  predniSONE (STERAPRED UNI-PAK 21 TAB) 10 MG (21) TBPK tablet Take 6 tablets day 1 and then one less tablet each day... (6,5,4,3,2,1). Take tablets in  the am with food. Patient not taking: Reported on 02/13/2018 11/26/17   Shella Maxim, NP    Family History No family history on file.  Social History Social History   Tobacco Use  . Smoking status: Never Smoker  . Smokeless tobacco: Never Used  Substance Use Topics  . Alcohol use: Yes    Comment: socially  . Drug use: No     Allergies   Ace inhibitors   Review of Systems Review of Systems  HENT: Positive for facial swelling, sore throat and trouble swallowing.   Respiratory: Negative for cough and shortness of breath.   All other systems reviewed and are negative.    Physical Exam Updated Vital  Signs BP 128/81   Pulse 83   Temp 98.1 F (36.7 C) (Oral)   Resp 16   Ht 5' 3"  (1.6 m)   Wt 72.6 kg   SpO2 98%   BMI 28.34 kg/m   Physical Exam  Constitutional: She is oriented to person, place, and time. She appears well-developed and well-nourished. No distress.  HENT:  Head: Normocephalic and atraumatic.  Airway patent.  No oral/tongue swelling.  She does have diffuse swelling to upper and lower lips.  Neck: Neck supple.  Cardiovascular: Normal rate, regular rhythm and normal heart sounds.  No murmur heard. Pulmonary/Chest: Effort normal and breath sounds normal. No respiratory distress.  Lungs are clear to auscultation bilaterally without wheezing.  Abdominal: Soft. She exhibits no distension. There is no tenderness.  Neurological: She is alert and oriented to person, place, and time.  Skin: Skin is warm and dry.  Nursing note and vitals reviewed.    ED Treatments / Results  Labs (all labs ordered are listed, but only abnormal results are displayed) Labs Reviewed  CBC WITH DIFFERENTIAL/PLATELET - Abnormal; Notable for the following components:      Result Value   MCH 34.1 (*)    All other components within normal limits  COMPREHENSIVE METABOLIC PANEL - Abnormal; Notable for the following components:   Potassium 3.4 (*)    Glucose, Bld 112 (*)    All other components within normal limits    EKG None  Radiology No results found.  Procedures Procedures (including critical care time)  Medications Ordered in ED Medications  EPINEPHrine (EPI-PEN) injection 0.3 mg (0 mg Intramuscular Hold 02/13/18 0742)  methylPREDNISolone sodium succinate (SOLU-MEDROL) 125 mg/2 mL injection 125 mg (125 mg Intravenous Given 02/13/18 0739)  famotidine (PEPCID) IVPB 20 mg premix (0 mg Intravenous Stopped 02/13/18 0836)     Initial Impression / Assessment and Plan / ED Course  I have reviewed the triage vital signs and the nursing notes.  Pertinent labs & imaging results that  were available during my care of the patient were reviewed by me and considered in my medical decision making (see chart for details).    Jackie Leonard is a 42 y.o. female who presents to ED for facial swelling and sensation of oral swelling.  History of similar followed by allergist.  Thought to be due to ACE inhibitor use, however has continued to have intermittent symptoms despite being off her lisinopril for over 2 months now.  She is scheduled to follow-up with her allergist this week, however when she awoke this morning, her hoarse voice and painful swallowing prompted her to seek care today.  On my examination, patient is afebrile, hemodynamically stable with no signs of airway compromise.  Her lungs are clear to auscultation bilaterally.  She does have diffuse swelling  to upper and lower lips, however no oral swelling.  She is maintaining her secretions well without any difficulty.  Given Solu-Medrol and Pepcid in the ED.  She took Benadryl just prior to arriving today.  She was observed for 4+ hours in the ER and did have improvement in her symptoms.  She was given an EpiPen for home and instructed on proper indications of use.  Agrees to call her allergist today to arrange appropriate follow-up.  Reasons to return to ER were discussed with patient at length.  All questions answered.    Final Clinical Impressions(s) / ED Diagnoses   Final diagnoses:  Allergic reaction, initial encounter    ED Discharge Orders         Ordered    EPINEPHrine 0.3 mg/0.3 mL IJ SOAJ injection   Once     02/13/18 1109           Ward, Ozella Almond, PA-C 02/13/18 1116    Maudie Flakes, MD 02/13/18 1553

## 2018-02-13 NOTE — Discharge Instructions (Signed)
It was my pleasure taking care of you today!   Please call your allergist and let them know about today's ER visit. You will need to follow up with them.   I have given you a prescription for an Epi-Pen. This is only to be used if you develop difficulty breathing or throat swelling. You should go to the nearest ER immediately if this does occur. You may return to the emergency department if symptoms worsen, become progressive, or become more concerning.

## 2018-02-13 NOTE — ED Triage Notes (Signed)
Pt comes in via POV. Pt is Ambulatory and AOx4. Pt chief complaint is difficulty swallowing which started around 0530. Pt has hx of angioedema. Pt is ha allergen test tomorrow pt stated due to unknown reasons why patient is having angioedema / allergic reactions.

## 2018-02-13 NOTE — ED Notes (Signed)
ED Provider at bedside. 

## 2018-02-13 NOTE — ED Notes (Signed)
Patient verbalized understanding of discharge instructions, no questions. Patient ambulated out of ED with steady gait in no distress.

## 2018-02-13 NOTE — ED Notes (Signed)
Bed: FB51 Expected date:  Expected time:  Means of arrival:  Comments: HOLD FOR RES

## 2018-02-13 NOTE — ED Notes (Signed)
Urine at bedside.

## 2018-02-14 ENCOUNTER — Ambulatory Visit (INDEPENDENT_AMBULATORY_CARE_PROVIDER_SITE_OTHER): Payer: 59 | Admitting: Allergy

## 2018-02-14 ENCOUNTER — Encounter: Payer: Self-pay | Admitting: Allergy

## 2018-02-14 VITALS — BP 128/92 | HR 99 | Temp 98.8°F | Resp 16 | Ht 64.0 in | Wt 170.6 lb

## 2018-02-14 DIAGNOSIS — T783XXA Angioneurotic edema, initial encounter: Secondary | ICD-10-CM | POA: Insufficient documentation

## 2018-02-14 DIAGNOSIS — T783XXD Angioneurotic edema, subsequent encounter: Secondary | ICD-10-CM | POA: Diagnosis not present

## 2018-02-14 DIAGNOSIS — L508 Other urticaria: Secondary | ICD-10-CM

## 2018-02-14 MED ORDER — FAMOTIDINE 20 MG PO TABS: 20.0000 mg | ORAL_TABLET | Freq: Two times a day (BID) | ORAL | 3 refills | Status: AC

## 2018-02-14 NOTE — Progress Notes (Signed)
New Patient Note  RE: Jackie Leonard MRN: [id number] DOB: 1976-04-18 Date of Office Visit: 02/14/2018  Referring provider: Helane Rima, MD Primary care provider: Helane Rima, MD  Chief Complaint: Urticaria  History of Present Illness: I had the pleasure of seeing Jackie Leonard for initial evaluation at the [facility] on 02/14/2018. She is a 42 y.o. female, who is referred here by Helane Rima, MD for the evaluation of hives/angioedema.   Patient started to break out in hives/angioedema in July. Describes it as erythematous and mildly pruritic. She had one episode of lip angioedema at the end of July. She was seen in urgent care and was told to stop lisinopril. Patient has been on lisinopril for many years prior to this. Since then she had about 5 more additional episodes of lip angioedema.   The hives usually take anywhere from a few hours to a few days to resolve. Some of them do leave some ecchymosis upon resolution. She has taken benadryl, zyrtec and prednisone in the past with some benefit. She can have an episode daily or once a week. Yesterday patient went to the ER due to lip angioedema and trouble swallowing. No history of trouble breathing. She was given solumedrol and Pepcid and this morning she is back to her normal self. Last Friday she also had some abdominal pains.   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 No family history of angioedema/urticaria. Denies any changes in diet, medications, personal care products or recent infections. She did start a new job in July when this started happening.  Patient is up to date with mammogram and pap smears which were normal.   Reviewed images on the phone which were consistent with lip angioedema.   Assessment and Plan: Jackie Leonard is a 42 y.o. female with: Chronic urticaria Ongoing hives since July 2019 with associated angioedema. No triggers noted. Treated with  prednisone, benadryl and zyrtec with some benefit. Denies any changes in medications, personal care products or infections. History of hives and angioedema as a child as well. CBC diff and CMP normal in 2019. No indication for any environmental or food testing at this time.  . Based on clinical history, she likely has chronic idiopathic urticaria. Discussed with patient, that urticaria is usually caused by release of histamine by cutaneous mast cells but sometimes it is non-histamine mediated. Urticaria is not always associated with allergies, and may be related to other infectious or autoimmune causes. In most cases, the exact etiology for urticaria can not be established and it is considered idiopathic. Marland Kitchen Discussed the role of adding omalizumab  (anti-IgE antibodies) in controlling urticaria in refractory patients.  . Meanwhile start the following medications:  o Start zyrtec 20m twice a day and if not controlled increase to 281mtwice a day. If causes drowsiness let usKoreanow. o Start Pepcid 2035mwice a day. . Avoid the following potential triggers: alcohol, tight clothing, NSAIDs. . Lab Orders     ANA w/Reflex     C4 complement     Sedimentation rate     Tryptase     Thyroid Cascade Profile     C-reactive protein     C1 Esterase Inhibitor     C1 esterase inhibitor, functional  Angio-edema Angioedema may be isolated or associated with urticaria. It may be histamine-induced (i.e. foods or certain medications) or bradykinin-mediated (i.e. Hereditary Angioedema or ACE-I). Patients with histamine-induced angioedema are more likely to have  associated symptoms of urticaria, pruritus or signs of anaphylaxis. Patients with bradykinin-mediated do not have any symptoms of pruritus or urticaria and they did not improve with Benadryl or Epinephrine. ACE-inhibitors are notorious to cause angioedema and has an incidence of 0.1-0.7% and orolingual involvement is common. ACE is involved in the degradation of  bradykinin and blocking it can cause increase in bradykinin resulting in angioedema. . Discontinue Lisinopril and avoid all ACE-inhibitors in the future. . Try to avoid NSAIDs if possible as this class has been known to lower threshhold for urticaria and angioedema.  Patient has epinephrine injectable and demonstrated proper use. For mild symptoms you can take over the counter antihistamines such as Benadryl and monitor symptoms closely. If symptoms worsen or if you have severe symptoms including breathing issues, throat closure, significant swelling, whole body hives, severe diarrhea and vomiting, lightheadedness then inject epinephrine and seek immediate medical care afterwards.  Get bloodwork as above.   Return in about 4 weeks (around 03/14/2018).  Meds ordered this encounter  Medications  . famotidine (PEPCID) 20 MG tablet    Sig: Take 1 tablet (20 mg total) by mouth 2 (two) times daily.    Dispense:  60 tablet    Refill:  3   Other allergy screening: Asthma: no Rhino conjunctivitis: no Food allergy: no Medication allergy: yes  Lisinopril - lip angioedema?  Hymenoptera allergy: no Urticaria: yes Eczema:no History of recurrent infections suggestive of immunodeficency: no  Diagnostics: Skin Testing: Deferred due to recent antihistamines use.  Past Medical History: Patient Active Problem List   Diagnosis Date Noted  . Chronic urticaria 02/14/2018  . Angio-edema 02/14/2018  . DDD (degenerative disc disease), lumbar 04/28/2014   Past Medical History:  Diagnosis Date  . Anxiety   . Hypertension    Past Surgical History: Past Surgical History:  Procedure Laterality Date  . APPENDECTOMY  2011  . BACK SURGERY  2015  . BREAST REDUCTION SURGERY  1998  . CESAREAN SECTION    . MAXIMUM ACCESS (MAS)POSTERIOR LUMBAR INTERBODY FUSION (PLIF) 1 LEVEL N/A 04/28/2014   Procedure: FOR MAXIMUM ACCESS (MAS) POSTERIOR LUMBAR INTERBODY FUSION (PLIF) 1 LEVEL;  Surgeon: Charlie Pitter, MD;   Location: [facility];  Service: Neurosurgery;  Laterality: N/A;  FOR MAXIMUM ACCESS (MAS) POSTERIOR LUMBAR INTERBODY FUSION (PLIF) 1 LEVEL LUMBAR 4-5   Medication List:  Current Outpatient Medications  Medication Sig Dispense Refill  . cetirizine (ZYRTEC) 10 MG tablet Take 1 tablet (10 mg total) by mouth at bedtime. 30 tablet 0  . desvenlafaxine (PRISTIQ) 100 MG 24 hr tablet Take 100 mg by mouth at bedtime.     . diphenhydrAMINE (BENADRYL) 50 MG capsule Take 50 mg by mouth every 6 (six) hours as needed.    . hydrochlorothiazide (HYDRODIURIL) 25 MG tablet Take 25 mg by mouth daily.    Marland Kitchen lisdexamfetamine (VYVANSE) 60 MG capsule Take 60 mg by mouth every morning.    Marland Kitchen LORazepam (ATIVAN) 1 MG tablet Take 1 mg by mouth every 4 (four) hours as needed for anxiety.    . famotidine (PEPCID) 20 MG tablet Take 1 tablet (20 mg total) by mouth 2 (two) times daily. 60 tablet 3   No current facility-administered medications for this visit.    Allergies: Allergies  Allergen Reactions  . Ace Inhibitors Swelling   Social History: Social History   Socioeconomic History  . Marital status: Married    Spouse name: Not on file  . Number of children: Not on file  .  Years of education: Not on file  . Highest education level: Not on file  Occupational History  . Not on file  Social Needs  . Financial resource strain: Not on file  . Food insecurity:    Worry: Not on file    Inability: Not on file  . Transportation needs:    Medical: Not on file    Non-medical: Not on file  Tobacco Use  . Smoking status: Never Smoker  . Smokeless tobacco: Never Used  Substance and Sexual Activity  . Alcohol use: Yes    Comment: socially  . Drug use: No  . Sexual activity: Yes  Lifestyle  . Physical activity:    Days per week: Not on file    Minutes per session: Not on file  . Stress: Not on file  Relationships  . Social connections:    Talks on phone: Not on file    Gets together: Not on file    Attends  religious service: Not on file    Active member of club or organization: Not on file    Attends meetings of clubs or organizations: Not on file    Relationship status: Not on file  Other Topics Concern  . Not on file  Social History Narrative  . Not on file   Lives in a 44 year old home. Smoking: denies Occupation: business Company secretary HistoryFreight forwarder in the house: no Carpet in the family room: no Carpet in the bedroom: yes Heating: gas Cooling: central Pet: yes 1 dog x few years.  Family History: Family History  Problem Relation Age of Onset  . Hypertension Father   . Hypertension Sister    Problem                               Relation Asthma                                   No  Eczema                                No  Food allergy                          No  Allergic rhino conjunctivitis     No   Review of Systems  Constitutional: Negative for appetite change, chills, fever and unexpected weight change.  HENT: Negative for congestion and rhinorrhea.   Eyes: Negative for itching.  Respiratory: Negative for cough, chest tightness, shortness of breath and wheezing.   Cardiovascular: Negative for chest pain.  Gastrointestinal: Negative for abdominal pain.  Genitourinary: Negative for difficulty urinating.  Skin: Positive for rash.  Allergic/Immunologic: Negative for environmental allergies and food allergies.  Neurological: Negative for headaches.   Objective: BP (!) 128/92 (BP Location: Left Arm, Patient Position: Sitting, Cuff Size: Normal)   Pulse 99   Temp 98.8 F (37.1 C) (Oral)   Resp 16   Ht 5' 4"  (1.626 m)   Wt 170 lb 9.6 oz (77.4 kg)   SpO2 98%   BMI 29.28 kg/m  Body mass index is 29.28 kg/m. Physical Exam  Constitutional: She is oriented to person, place, and time. She appears well-developed and well-nourished.  HENT:  Head: Normocephalic and atraumatic.  Right Ear: External ear normal.  Left Ear: External  ear normal.  Nose: Nose normal.  Mouth/Throat: Oropharynx is clear and moist.  Eyes: Conjunctivae and EOM are normal.  Neck: Neck supple.  Cardiovascular: Normal rate, regular rhythm and normal heart sounds. Exam reveals no gallop and no friction rub.  No murmur heard. Pulmonary/Chest: Effort normal and breath sounds normal. She has no wheezes. She has no rales.  Abdominal: Soft. Bowel sounds are normal. There is no tenderness.  Lymphadenopathy:    She has no cervical adenopathy.  Neurological: She is alert and oriented to person, place, and time.  Skin: Skin is warm. No rash noted.  Psychiatric: She has a normal mood and affect. Her behavior is normal.  Nursing note and vitals reviewed.  The plan was reviewed with the patient/family, and all questions/concerned were addressed.  It was my pleasure to see Jackie Leonard today and participate in her care. Please feel free to contact me with any questions or concerns.  Sincerely,  Rexene Alberts, DO Allergy & Immunology  [facility] office: [phone] [place]

## 2018-02-14 NOTE — Assessment & Plan Note (Addendum)
Ongoing hives since July 2019 with associated angioedema. No triggers noted. Treated with prednisone, benadryl and zyrtec with some benefit. Denies any changes in medications, personal care products or infections. History of hives and angioedema as a child as well. CBC diff and CMP normal in 2019. No indication for any environmental or food testing at this time.  . Based on clinical history, she likely has chronic idiopathic urticaria. Discussed with patient, that urticaria is usually caused by release of histamine by cutaneous mast cells but sometimes it is non-histamine mediated. Urticaria is not always associated with allergies, and may be related to other infectious or autoimmune causes. In most cases, the exact etiology for urticaria can not be established and it is considered idiopathic. Marland Kitchen Discussed the role of adding omalizumab  (anti-IgE antibodies) in controlling urticaria in refractory patients.  . Meanwhile start the following medications:  o Start zyrtec 60m twice a day and if not controlled increase to 233mtwice a day. If causes drowsiness let usKoreanow. o Start Pepcid 2054mwice a day. . Avoid the following potential triggers: alcohol, tight clothing, NSAIDs. . Lab Orders     ANA w/Reflex     C4 complement     Sedimentation rate     Tryptase     Thyroid Cascade Profile     C-reactive protein     C1 Esterase Inhibitor     C1 esterase inhibitor, functional

## 2018-02-14 NOTE — Patient Instructions (Addendum)
Chronic urticaria Ongoing hives since July 2019 with associated angioedema. No triggers noted. Treated with prednisone, benadryl and zyrtec with some benefit. Denies any changes in medications, personal care products or infections. History of hives and angioedema as a child as well. CBC diff and CMP normal in 2019. No indication for any environmental or food testing at this time.  . Based on clinical history, she likely has chronic idiopathic urticaria. Discussed with patient, that urticaria is usually caused by release of histamine by cutaneous mast cells but sometimes it is non-histamine mediated. Urticaria is not always associated with allergies, and may be related to other infectious or autoimmune causes. In most cases, the exact etiology for urticaria can not be established and it is considered idiopathic. Marland Kitchen Discussed the role of adding omalizumab  (anti-IgE antibodies) in controlling urticaria in refractory patients.  . Meanwhile start the following medications:  o Start zyrtec 47m twice a day and if not controlled increase to 251mtwice a day. If causes drowsiness let usKoreanow. o Start Pepcid 2071mwice a day. . Avoid the following potential triggers: alcohol, tight clothing, NSAIDs. . Lab Orders     ANA w/Reflex     C4 complement     Sedimentation rate     Tryptase     Thyroid Cascade Profile     C-reactive protein     C1 Esterase Inhibitor     C1 esterase inhibitor, functional  Angio-edema Angioedema may be isolated or associated with urticaria. It may be histamine-induced (i.e. foods or certain medications) or bradykinin-mediated (i.e. Hereditary Angioedema or ACE-I). Patients with histamine-induced angioedema are more likely to have associated symptoms of urticaria, pruritus or signs of anaphylaxis. Patients with bradykinin-mediated do not have any symptoms of pruritus or urticaria and they did not improve with Benadryl or Epinephrine. ACE-inhibitors are notorious to cause angioedema  and has an incidence of 0.1-0.7% and orolingual involvement is common. ACE is involved in the degradation of bradykinin and blocking it can cause increase in bradykinin resulting in angioedema. . Discontinue Lisinopril and avoid all ACE-inhibitors in the future. . Try to avoid NSAIDs if possible as this class has been known to lower threshhold for urticaria and angioedema.  Patient has epinephrine injectable and demonstrated proper use. For mild symptoms you can take over the counter antihistamines such as Benadryl and monitor symptoms closely. If symptoms worsen or if you have severe symptoms including breathing issues, throat closure, significant swelling, whole body hives, severe diarrhea and vomiting, lightheadedness then inject epinephrine and seek immediate medical care afterwards.  Get bloodwork as above.   Return in about 4 weeks (around 03/14/2018).

## 2018-02-14 NOTE — Assessment & Plan Note (Addendum)
Angioedema may be isolated or associated with urticaria. It may be histamine-induced (i.e. foods or certain medications) or bradykinin-mediated (i.e. Hereditary Angioedema or ACE-I). Patients with histamine-induced angioedema are more likely to have associated symptoms of urticaria, pruritus or signs of anaphylaxis. Patients with bradykinin-mediated do not have any symptoms of pruritus or urticaria and they did not improve with Benadryl or Epinephrine. ACE-inhibitors are notorious to cause angioedema and has an incidence of 0.1-0.7% and orolingual involvement is common. ACE is involved in the degradation of bradykinin and blocking it can cause increase in bradykinin resulting in angioedema. . Discontinue Lisinopril and avoid all ACE-inhibitors in the future. . Try to avoid NSAIDs if possible as this class has been known to lower threshhold for urticaria and angioedema.  Patient has epinephrine injectable and demonstrated proper use. For mild symptoms you can take over the counter antihistamines such as Benadryl and monitor symptoms closely. If symptoms worsen or if you have severe symptoms including breathing issues, throat closure, significant swelling, whole body hives, severe diarrhea and vomiting, lightheadedness then inject epinephrine and seek immediate medical care afterwards.  Get bloodwork as above.

## 2018-02-18 LAB — C4 COMPLEMENT: COMPLEMENT C4, SERUM: 31 mg/dL (ref 14–44)

## 2018-02-18 LAB — SEDIMENTATION RATE: SED RATE: 2 mm/h (ref 0–32)

## 2018-02-18 LAB — THYROID CASCADE PROFILE: TSH: 1.24 u[IU]/mL (ref 0.450–4.500)

## 2018-02-18 LAB — TRYPTASE: TRYPTASE: 4 ug/L (ref 2.2–13.2)

## 2018-02-18 LAB — C1 ESTERASE INHIBITOR, FUNCTIONAL: C1INH Functional/C1INH Total MFr SerPl: 86 %mean normal

## 2018-02-18 LAB — C1 ESTERASE INHIBITOR: C1INH SerPl-mCnc: 28 mg/dL (ref 21–39)

## 2018-02-18 LAB — ANA W/REFLEX: Anti Nuclear Antibody(ANA): NEGATIVE

## 2018-02-18 LAB — C-REACTIVE PROTEIN: CRP: 2 mg/L (ref 0–10)

## 2018-02-20 ENCOUNTER — Encounter: Payer: Self-pay | Admitting: Allergy

## 2018-03-14 ENCOUNTER — Ambulatory Visit: Payer: 59 | Admitting: Allergy

## 2018-03-21 ENCOUNTER — Ambulatory Visit: Payer: 59 | Admitting: Allergy

## 2018-03-21 ENCOUNTER — Encounter: Payer: Self-pay | Admitting: Allergy

## 2018-03-21 VITALS — BP 140/92 | HR 90 | Resp 18

## 2018-03-21 DIAGNOSIS — L508 Other urticaria: Secondary | ICD-10-CM

## 2018-03-21 DIAGNOSIS — T783XXD Angioneurotic edema, subsequent encounter: Secondary | ICD-10-CM

## 2018-03-21 NOTE — Assessment & Plan Note (Signed)
Past history - Ongoing hives since July 2019 with associated angioedema. No triggers noted. Treated with prednisone, benadryl and zyrtec with some benefit. Denies any changes in medications, personal care products or infections. History of hives and angioedema as a child as well. CBC diff and CMP normal in 2019. No indication for any environmental or food testing at this time.  Interim history - Hive free while on zyrtec 69m BID and Pepcid 215mBID. Patient ran out of zyrtec for 1 day and the hives returned. Labwork was negative to the following -   ANA w/Reflex, C4 complement, Sedimentation rate, Tryptase, Thyroid Cascade Profile, C-reactive protein, C1 Esterase Inhibitor, C1 esterase inhibitor, functional  Patient most likely has idiopathic urticaria.  o Restart zyrtec 1033mwice a day and if not controlled increase to 22m6mice a day. If causes drowsiness let us kKoreaw. o Continue Pepcid 22mg80mce a day. o Avoid the following potential triggers: alcohol, tight clothing, NSAIDs.

## 2018-03-21 NOTE — Assessment & Plan Note (Signed)
Interim history - no more episodes. Most likely related to urticaria. Bloodwork was all normal. See plan as above for urticaria.

## 2018-03-21 NOTE — Progress Notes (Signed)
Follow Up Note  RE: Alva Kuenzel MRN: [id number] DOB: 1975-08-23 Date of Office Visit: 03/21/2018  Referring provider: Helane Rima, MD Primary care provider: Helane Rima, MD  Chief Complaint: Urticaria  History of Present Illness: I had the pleasure of seeing Malayzia Laforte for a follow up visit at the [facility] [place] [facility] on 03/21/2018. She is a 42 y.o. female, who is being followed for urticaria and angioedema. Today she is here for regular follow up visit. Her previous allergy office visit was on 02/14/2018 with Dr. Maudie Mercury.   Urticaria: Patient was taking zyrtec 32m BID and Pepcid 252mBID with good control of symptoms. No hives/swelling or itching. She ran out of zyrtec Tuesday and now having hives and itching on the arms, legs, hips.  Assessment and Plan: CoFeiges a [age].o. female with: Chronic urticaria Past history - Ongoing hives since July 2019 with associated angioedema. No triggers noted. Treated with prednisone, benadryl and zyrtec with some benefit. Denies any changes in medications, personal care products or infections. History of hives and angioedema as a child as well. CBC diff and CMP normal in 2019. No indication for any environmental or food testing at this time.  Interim history - Hive free while on zyrtec 1033mID and Pepcid 90m29mD. Patient ran out of zyrtec for 1 day and the hives returned. Labwork was negative to the following -   ANA w/Reflex, C4 complement, Sedimentation rate, Tryptase, Thyroid Cascade Profile, C-reactive protein, C1 Esterase Inhibitor, C1 esterase inhibitor, functional  Patient most likely has idiopathic urticaria.  o Restart zyrtec 10mg8mce a day and if not controlled increase to 90mg 30me a day. If causes drowsiness let us knoKorea o Continue Pepcid 90mg t12m a day. o Avoid the following potential triggers: alcohol, tight clothing, NSAIDs.  Angio-edema Interim history - no more episodes. Most likely related to  urticaria. Bloodwork was all normal. See plan as above for urticaria.  Return in about 2 months (around 05/21/2018).  Diagnostics: None.   Medication List:  Current Outpatient Medications  Medication Sig Dispense Refill  . cetirizine (ZYRTEC) 10 MG tablet Take 1 tablet (10 mg total) by mouth at bedtime. 30 tablet 0  . desvenlafaxine (PRISTIQ) 100 MG 24 hr tablet Take 100 mg by mouth at bedtime.     . diphenhydrAMINE (BENADRYL) 50 MG capsule Take 50 mg by mouth every 6 (six) hours as needed.    . famotidine (PEPCID) 20 MG tablet Take 1 tablet (20 mg total) by mouth 2 (two) times daily. 60 tablet 3  . hydrochlorothiazide (HYDRODIURIL) 25 MG tablet Take 25 mg by mouth daily.    . lisdeMarland Kitchenamfetamine (VYVANSE) 60 MG capsule Take 60 mg by mouth every morning.    . LORazMarland Kitchenpam (ATIVAN) 1 MG tablet Take 1 mg by mouth every 4 (four) hours as needed for anxiety.     No current facility-administered medications for this visit.    Allergies: Allergies  Allergen Reactions  . Ace Inhibitors Swelling   I reviewed her past medical history, social history, family history, and environmental history and no significant changes have been reported from previous visit on 02/14/2018.  Review of Systems  Constitutional: Negative for appetite change, chills, fever and unexpected weight change.  HENT: Negative for congestion and rhinorrhea.   Eyes: Negative for itching.  Respiratory: Negative for cough, chest tightness, shortness of breath and wheezing.   Cardiovascular: Negative for chest pain.  Gastrointestinal: Negative for abdominal pain.  Genitourinary: Negative for  difficulty urinating.  Skin: Positive for rash.  Allergic/Immunologic: Negative for environmental allergies and food allergies.  Neurological: Negative for headaches.   Objective: BP (!) 140/92 (BP Location: Left Arm, Patient Position: Sitting, Cuff Size: Normal)   Pulse 90   Resp 18   SpO2 93%  There is no height or weight on file to  calculate BMI. Physical Exam  Constitutional: She is oriented to person, place, and time. She appears well-developed and well-nourished.  HENT:  Head: Normocephalic and atraumatic.  Right Ear: External ear normal.  Left Ear: External ear normal.  Nose: Nose normal.  Mouth/Throat: Oropharynx is clear and moist.  Eyes: Conjunctivae and EOM are normal.  Neck: Neck supple.  Cardiovascular: Normal rate, regular rhythm and normal heart sounds. Exam reveals no gallop and no friction rub.  No murmur heard. Pulmonary/Chest: Effort normal and breath sounds normal. She has no wheezes. She has no rales.  Abdominal: Soft.  Lymphadenopathy:    She has no cervical adenopathy.  Neurological: She is alert and oriented to person, place, and time.  Skin: Skin is warm. Rash noted.  Diffuse urticarial rashes on lower torso area and upper extremities b/l.  Psychiatric: She has a normal mood and affect. Her behavior is normal.  Nursing note and vitals reviewed.  Previous notes and tests were reviewed. The plan was reviewed with the patient/family, and all questions/concerned were addressed.  It was my pleasure to see Zanae today and participate in her care. Please feel free to contact me with any questions or concerns.  Sincerely,  Rexene Alberts, DO Allergy & Immunology  [facility] office: [phone] [place]

## 2018-03-21 NOTE — Patient Instructions (Signed)
   Continue Zyrtec 45m twice a day and if not controlled increase to 273mtwice a day. If causes drowsiness let usKoreanow.  Continue Pepcid 2064mwice a day.   Avoid the following potential triggers: alcohol, tight clothing, NSAIDs.   Avoid Lisinopril and avoid all ACE-inhibitors in the future.  Try to avoid NSAIDs if possible as this class has been known to lower threshold for urticaria and angioedema.   Follow up in 2 months

## 2018-04-05 ENCOUNTER — Telehealth: Payer: Self-pay | Admitting: *Deleted

## 2018-04-05 ENCOUNTER — Other Ambulatory Visit: Payer: Self-pay | Admitting: *Deleted

## 2018-04-05 DIAGNOSIS — T7840XA Allergy, unspecified, initial encounter: Secondary | ICD-10-CM

## 2018-04-05 MED ORDER — CETIRIZINE HCL 10 MG PO TABS: 10.0000 mg | ORAL_TABLET | Freq: Every day | ORAL | 5 refills | Status: AC

## 2018-04-05 NOTE — Telephone Encounter (Signed)
Patient called stating her zyrtec was never called in. Please advise   [facility] [place]

## 2018-04-05 NOTE — Telephone Encounter (Signed)
Prescription has been sent in. Called the patient and informed. Patient verbalized understanding.
